# Patient Record
Sex: Female | Born: 1935 | Race: White | Hispanic: No | Smoking: Never smoker
Health system: Southern US, Community
[De-identification: ages and names within clinical notes are randomized; demographics above are authoritative.]

## PROBLEM LIST (undated history)

## (undated) DIAGNOSIS — M1990 Unspecified osteoarthritis, unspecified site: Secondary | ICD-10-CM

## (undated) DIAGNOSIS — Z86718 Personal history of other venous thrombosis and embolism: Secondary | ICD-10-CM

## (undated) DIAGNOSIS — Z96651 Presence of right artificial knee joint: Secondary | ICD-10-CM

## (undated) DIAGNOSIS — F0390 Unspecified dementia without behavioral disturbance: Secondary | ICD-10-CM

## (undated) HISTORY — PX: TOTAL KNEE ARTHROPLASTY: SHX125

---

## 2004-02-02 ENCOUNTER — Ambulatory Visit: Payer: Self-pay | Admitting: Unknown Physician Specialty

## 2005-02-20 ENCOUNTER — Ambulatory Visit: Payer: Self-pay | Admitting: Unknown Physician Specialty

## 2006-03-12 ENCOUNTER — Ambulatory Visit: Payer: Self-pay | Admitting: Unknown Physician Specialty

## 2006-05-30 ENCOUNTER — Ambulatory Visit: Payer: Self-pay | Admitting: Unknown Physician Specialty

## 2007-05-12 ENCOUNTER — Ambulatory Visit: Payer: Self-pay | Admitting: Unknown Physician Specialty

## 2008-01-23 DIAGNOSIS — C8300 Small cell B-cell lymphoma, unspecified site: Secondary | ICD-10-CM

## 2008-01-23 HISTORY — DX: Small cell B-cell lymphoma, unspecified site: C83.00

## 2008-07-08 ENCOUNTER — Ambulatory Visit: Payer: Self-pay | Admitting: Unknown Physician Specialty

## 2009-07-11 ENCOUNTER — Ambulatory Visit: Payer: Self-pay | Admitting: Unknown Physician Specialty

## 2009-09-05 ENCOUNTER — Ambulatory Visit: Payer: Self-pay | Admitting: Gastroenterology

## 2009-09-07 ENCOUNTER — Ambulatory Visit: Payer: Self-pay | Admitting: Gastroenterology

## 2009-10-22 ENCOUNTER — Ambulatory Visit: Payer: Self-pay | Admitting: Internal Medicine

## 2009-11-09 ENCOUNTER — Ambulatory Visit: Payer: Self-pay | Admitting: Internal Medicine

## 2009-11-22 ENCOUNTER — Ambulatory Visit: Payer: Self-pay | Admitting: Internal Medicine

## 2009-12-12 ENCOUNTER — Ambulatory Visit: Payer: Self-pay | Admitting: Gastroenterology

## 2010-03-21 ENCOUNTER — Ambulatory Visit: Payer: Self-pay | Admitting: Internal Medicine

## 2010-03-23 ENCOUNTER — Ambulatory Visit: Payer: Self-pay | Admitting: Internal Medicine

## 2010-05-16 ENCOUNTER — Ambulatory Visit: Payer: Self-pay | Admitting: Internal Medicine

## 2010-05-23 ENCOUNTER — Ambulatory Visit: Payer: Self-pay | Admitting: Internal Medicine

## 2010-07-14 ENCOUNTER — Ambulatory Visit: Payer: Self-pay | Admitting: Unknown Physician Specialty

## 2011-01-11 ENCOUNTER — Other Ambulatory Visit (HOSPITAL_COMMUNITY): Payer: Self-pay | Admitting: Oncology

## 2011-01-11 DIAGNOSIS — C8292 Follicular lymphoma, unspecified, intrathoracic lymph nodes: Secondary | ICD-10-CM

## 2011-01-19 ENCOUNTER — Encounter (HOSPITAL_COMMUNITY)
Admission: RE | Admit: 2011-01-19 | Discharge: 2011-01-19 | Disposition: A | Discharge status: Home / Self Care | Payer: Medicare Other | Source: Ambulatory Visit | Attending: Oncology | Admitting: Oncology

## 2011-01-19 DIAGNOSIS — C8292 Follicular lymphoma, unspecified, intrathoracic lymph nodes: Secondary | ICD-10-CM | POA: Insufficient documentation

## 2011-01-19 DIAGNOSIS — R222 Localized swelling, mass and lump, trunk: Secondary | ICD-10-CM | POA: Insufficient documentation

## 2011-01-19 DIAGNOSIS — J90 Pleural effusion, not elsewhere classified: Secondary | ICD-10-CM | POA: Insufficient documentation

## 2011-01-19 LAB — GLUCOSE, CAPILLARY: Glucose-Capillary: 98 mg/dL (ref 70–99)

## 2011-01-19 MED ORDER — FLUDEOXYGLUCOSE F - 18 (FDG) INJECTION
17.1000 | Freq: Once | INTRAVENOUS | Status: AC | PRN
  Administered 2011-01-19: 17.1 via INTRAVENOUS

## 2011-07-16 ENCOUNTER — Ambulatory Visit: Payer: Self-pay | Admitting: Unknown Physician Specialty

## 2012-06-17 IMAGING — MG MAM DGTL SCREENING MAMMO W/CAD
1 series · 5 of 5 positions shown · non-contrast
Comparison: none

REASON FOR EXAM: screening
COMMENTS:  Submitted by practice: [facility] [HOSPITAL] Scheduled by
user: [place]

[Series 7877: R CC · right · 5 of 5 slices shown]
[im 1/5]
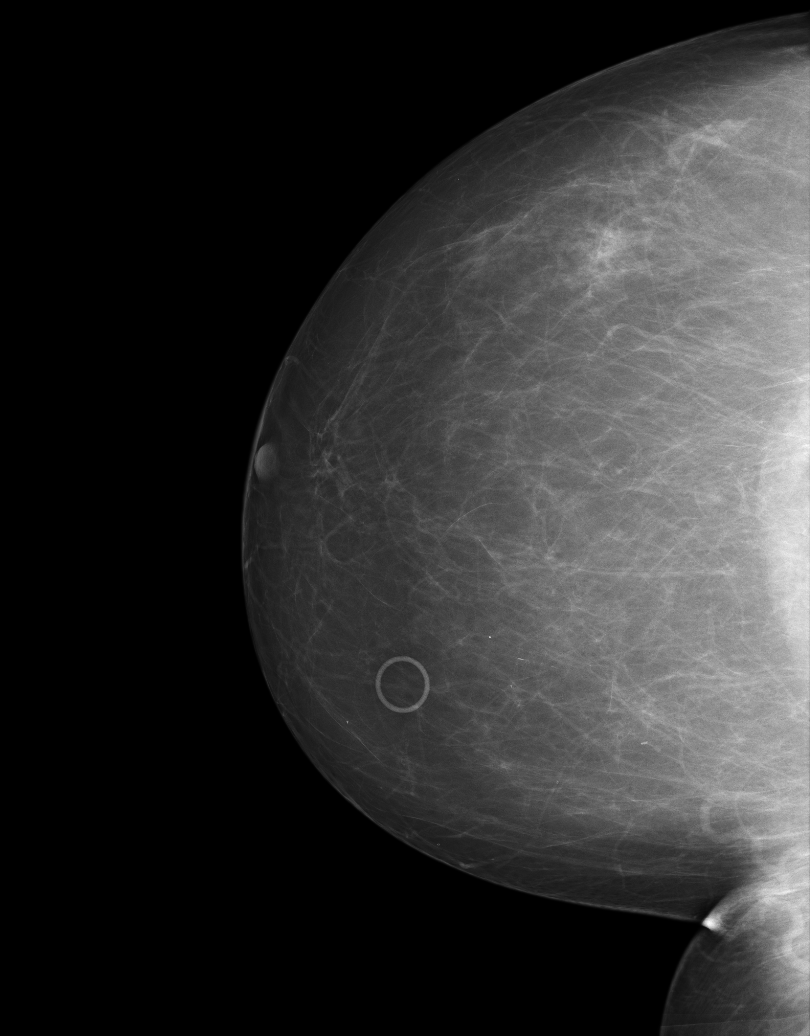
[im 2/5]
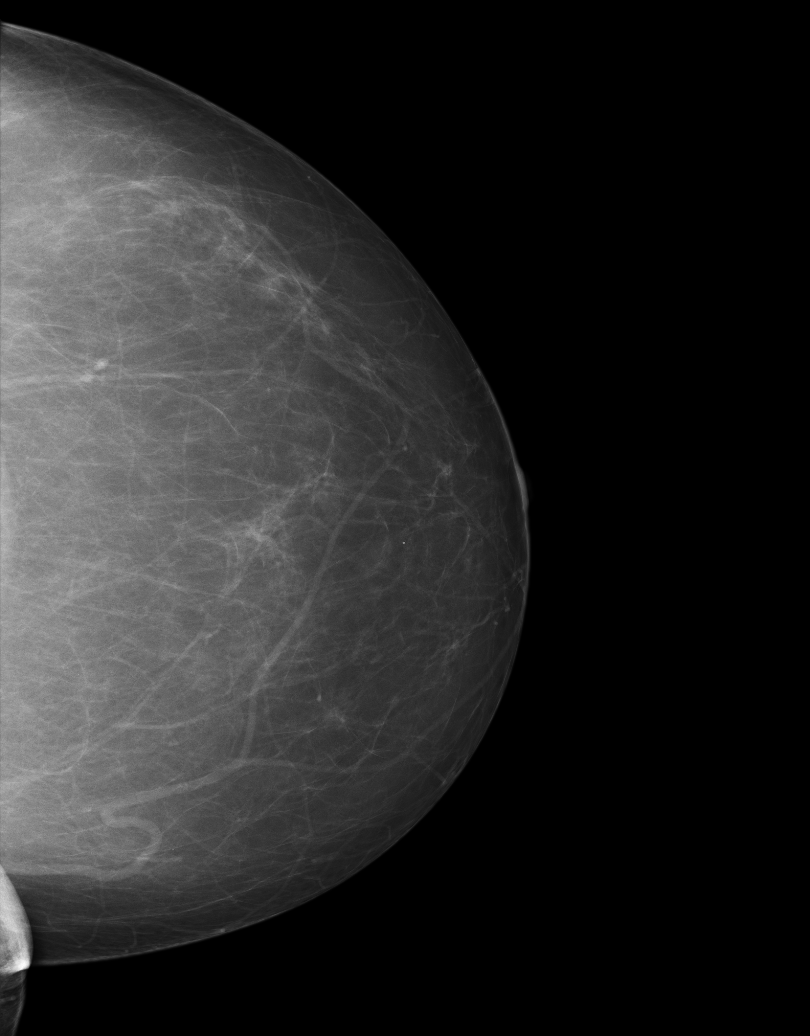
[im 3/5]
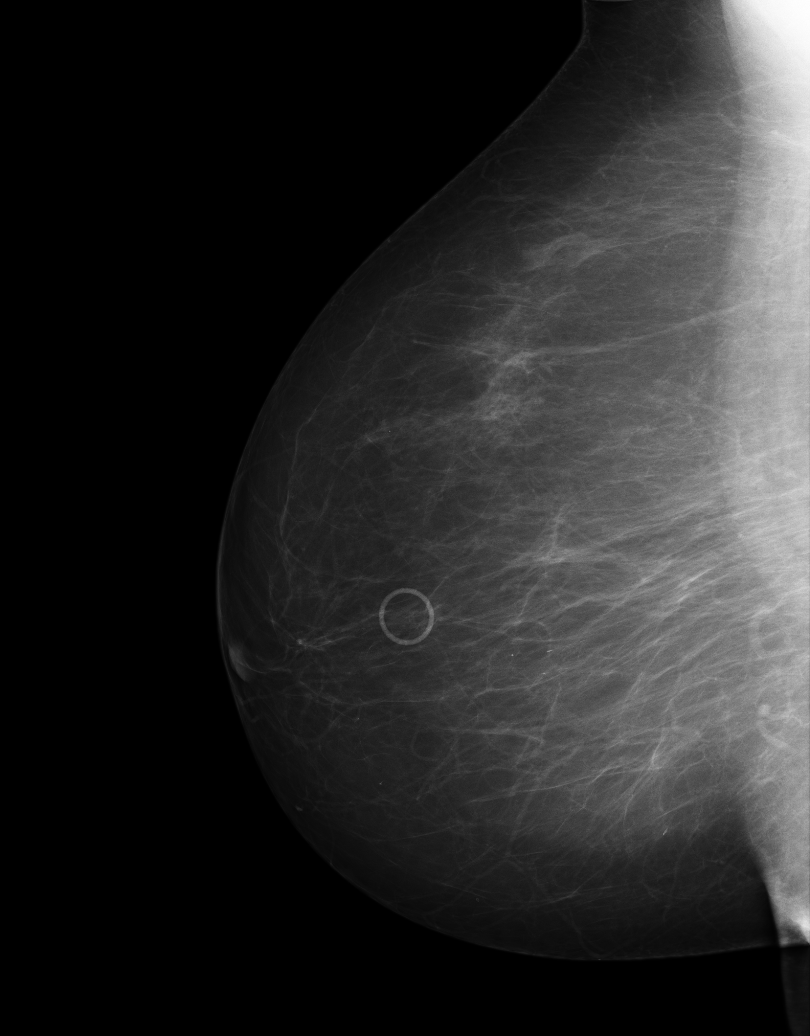
[im 4/5]
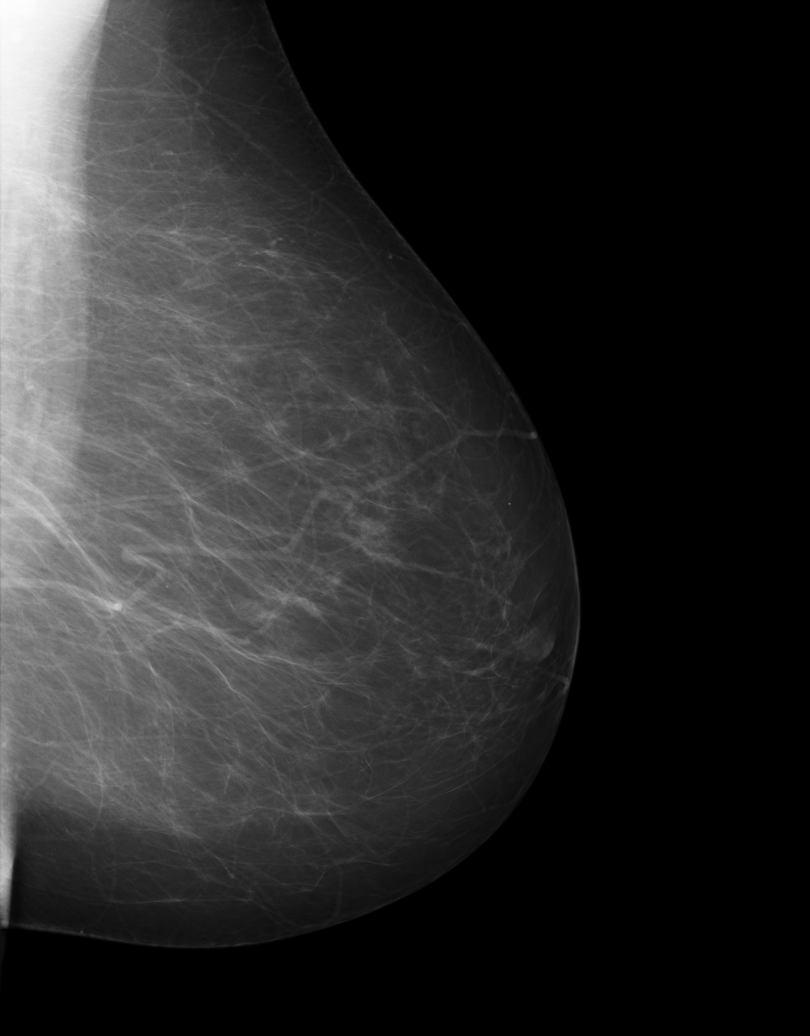
[im 5/5]
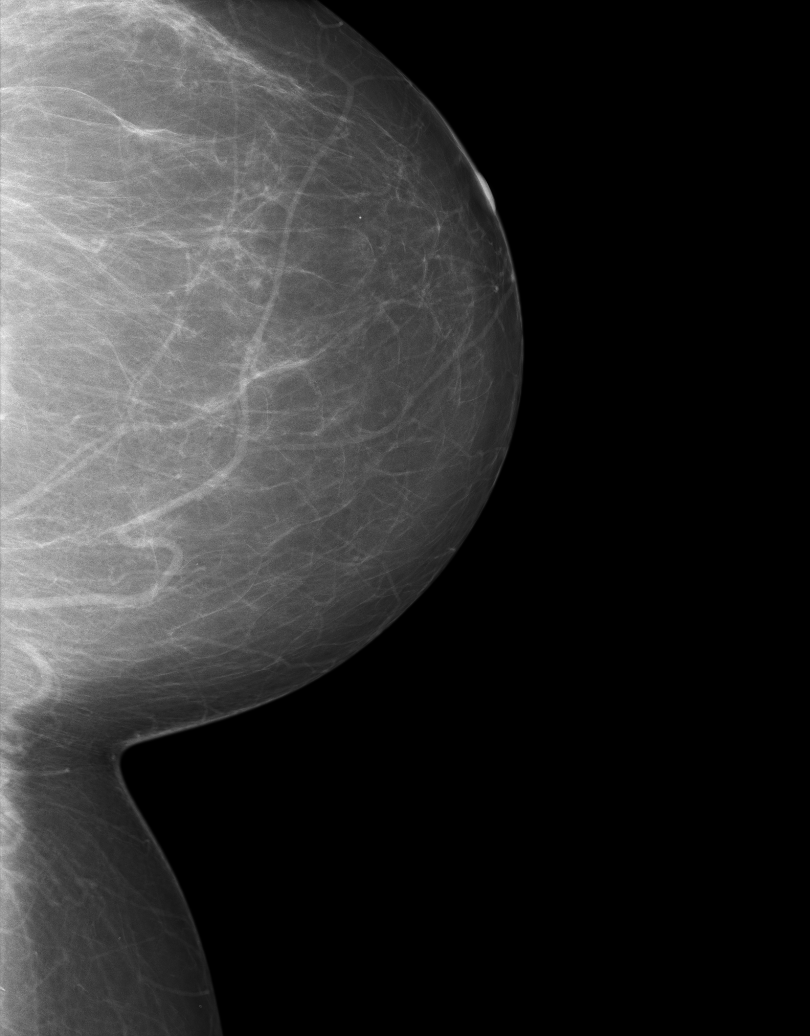

[5 of 5 positions shown; findings below may reference images not displayed]

PROCEDURE:     MAM - MAM DGTL SCREENING MAMMO W/CAD  - July 11, 2009  [DATE]

RESULT:      Comparison is made to the previous analog images of 05/12/07 and
to digital images dated 07/08/08 as well as 12/09/00.
The breasts exhibit a mildly dense parenchymal pattern without evidence of
an area of developing density, dominant mass or malignant-appearing
calcification. A skin nevus is marked along the anterior medial right
breast.
IMPRESSION: 1.     Stable benign-appearing bilateral mammogram.
2.     BI-RADS:  Category 2- Benign Finding.
3.     Please continue to encourage annual mammographic follow up.

A negative mammogram report does not preclude biopsy or other evaluation of
a clinically palpable or otherwise suspicious mass or lesion. Breast cancer
may not be detected by mammography in up to 10% of cases.

## 2012-07-16 ENCOUNTER — Ambulatory Visit: Payer: Self-pay | Admitting: Physician Assistant

## 2013-07-17 ENCOUNTER — Ambulatory Visit: Payer: Self-pay | Admitting: Unknown Physician Specialty

## 2013-07-21 ENCOUNTER — Ambulatory Visit: Payer: Self-pay | Admitting: Gastroenterology

## 2013-07-21 LAB — PROTIME-INR
INR: 0.9
Prothrombin Time: 12 secs (ref 11.5–14.7)

## 2013-12-26 IMAGING — PT NM PET TUM IMG INITIAL (PI) SKULL BASE T - THIGH
6 series · 25 of 25 positions shown · non-contrast
Comparison: CTs of 01/08/2011 from [facility].  No prior
PET.

CLINICAL DATA: Initial treatment strategy for nodular lymphoma of
intrathoracic lymph nodes.

NUCLEAR MEDICINE PET CT SKULL BASE TO THIGH
TECHNIQUE: 17.1 mCi F-18 FDG was injected intravenously via the
left AC.  Full-ring PET imaging was performed from the skull base
through the mid-thighs 70  minutes after injection.  CT data was
obtained and used for attenuation correction and anatomic
localization only.  (This was not acquired as a diagnostic CT
examination.)
Fasting Blood Glucose:  98

[Series 1: pet ac · axial · 3.3mm · 4.69mm/px · z∈[-871,-1]mm · 5 of 267 slices shown]
[im 1/267]
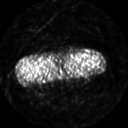
[im 67/267]
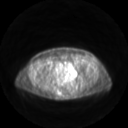
[im 134/267]
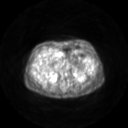
[im 200/267]
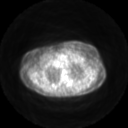
[im 267/267]
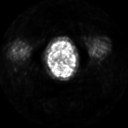

[Series 2: pet nac · axial · 3.3mm · 4.69mm/px · z∈[-871,-1]mm · 6 of 267 slices shown]
[im 1/267]
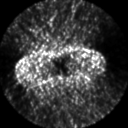
[im 54/267]
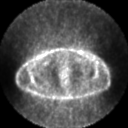
[im 107/267]
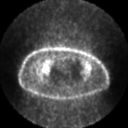
[im 160/267]
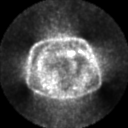
[im 213/267]
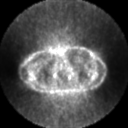
[im 267/267]
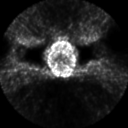

[Series 2: ct images · axial · 3.8mm · 0.98mm/px · z∈[-871,-2]mm · 5 of 267 slices shown]
[im 1/267]
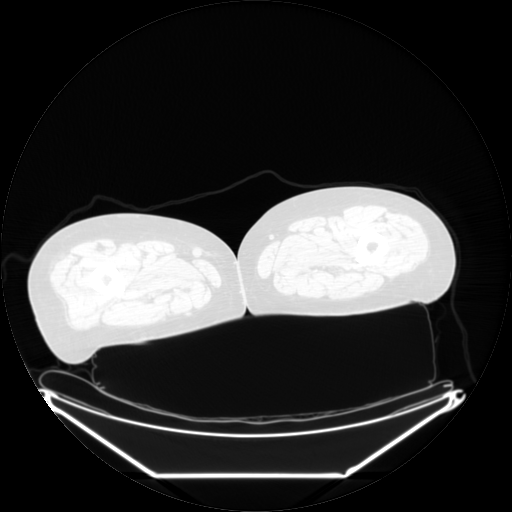
[im 67/267]
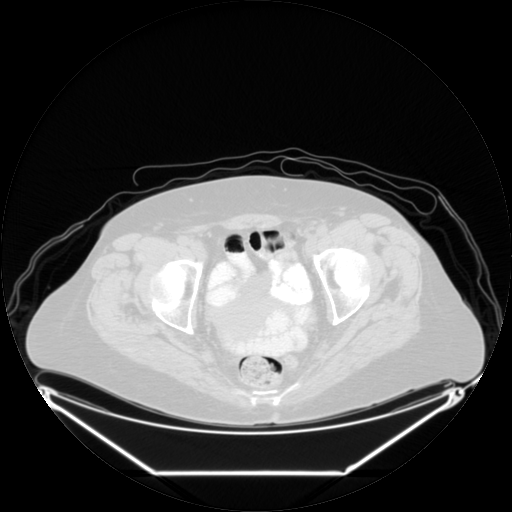
[im 134/267]
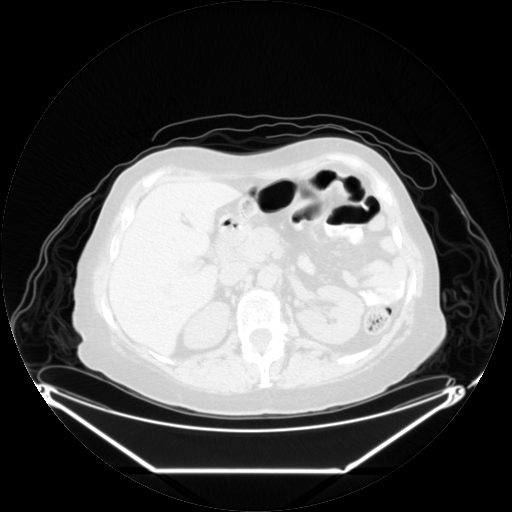
[im 200/267]
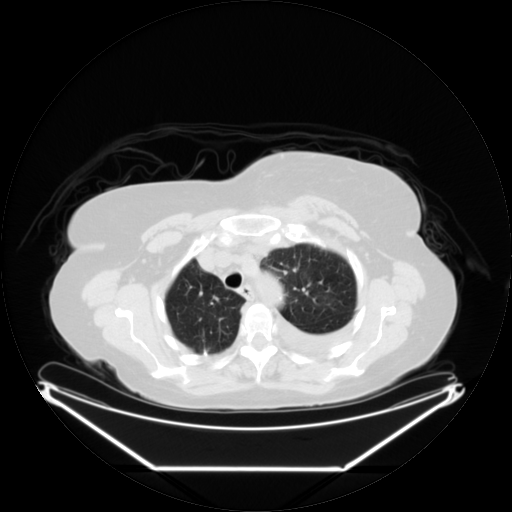
[im 267/267  brain]
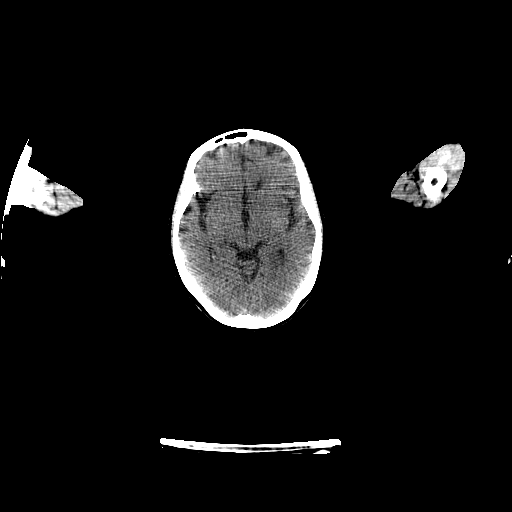

[Series 123: mip · coronal · 3.3mm · 4.69mm/px · 1 of 30 slices shown]
[im 1/30]
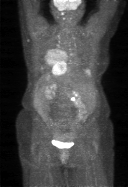

[Series 151: reformatted · axial · 3.3mm · 3.91mm/px · z∈[-871,-1]mm · 6 of 267 slices shown (1 of 2)]
[im 1/267]
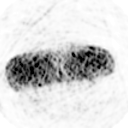
[im 54/267]
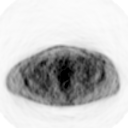
[im 107/267]
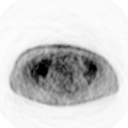
[im 160/267]
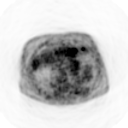
[im 213/267]
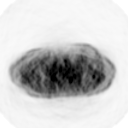
[im 267/267]
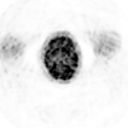

[Series 153: reformatted · coronal · 4.7mm · 6.98mm/px · 2 of 75 slices shown (2 of 2)]
[im 1/75]
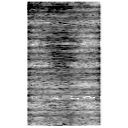
[im 75/75]
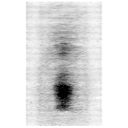

[25 of 25 positions shown; findings below may reference images not displayed]

FINDINGS: PET images demonstrate multifocal areas of
hypermetabolism within the upper neck.  These are felt to be
muscular in etiology and secondary motion after pharmaceutical
injection.

Within the medial upper chest posteriorly, areas of hypermetabolism
about the costovertebral junctions are felt to be related to
muscular activity and/or hypermetabolic "brown" fat.

Left chest wall node measures 1.1 cm and a S.U.V. max of 2.4 on
image 59.

The left anterior chest wall mass measures 9.6 x 5.1 cm on recent
CT.  This measures a S.U.V. max of 11.4 on image 84.

A focus of left-sided pleural hypermetabolism, within the small
left pleural effusion measures a S.U.V. max of 7.9.

Bilateral hypermetabolic pulmonary nodules and masses.  Index right
middle lobe mass measures 5.2 x 3.7 cm on the prior diagnostic CT.
This measures a S.U.V. max of 6.1 on image 105 today.

Hypermetabolic mass within the inferior anterior left pleural space
on image 117.  No evidence of hypermetabolic subdiaphragmatic
disease.

CT images performed for attenuation correction demonstrate no
significant findings within the neck.  Chest, abdomen, and pelvic
findings deferred to recent diagnostic CTs.  No evidence of acute
superimposed process.  IVC filter.
IMPRESSION: 1.  Extensive active disease within the chest.
2.  Likely muscular hypermetabolism about the neck. Recommend
attention on follow-up.
3.  No evidence of subdiaphragmatic disease.

## 2015-01-27 DIAGNOSIS — R5383 Other fatigue: Secondary | ICD-10-CM | POA: Diagnosis not present

## 2015-01-27 DIAGNOSIS — E782 Mixed hyperlipidemia: Secondary | ICD-10-CM | POA: Diagnosis not present

## 2015-01-27 DIAGNOSIS — Z0000 Encounter for general adult medical examination without abnormal findings: Secondary | ICD-10-CM | POA: Diagnosis not present

## 2015-01-27 DIAGNOSIS — I82521 Chronic embolism and thrombosis of right iliac vein: Secondary | ICD-10-CM | POA: Diagnosis not present

## 2015-01-27 DIAGNOSIS — I10 Essential (primary) hypertension: Secondary | ICD-10-CM | POA: Diagnosis not present

## 2015-03-09 DIAGNOSIS — I82521 Chronic embolism and thrombosis of right iliac vein: Secondary | ICD-10-CM | POA: Diagnosis not present

## 2015-04-08 DIAGNOSIS — I82521 Chronic embolism and thrombosis of right iliac vein: Secondary | ICD-10-CM | POA: Diagnosis not present

## 2015-04-12 DIAGNOSIS — M0579 Rheumatoid arthritis with rheumatoid factor of multiple sites without organ or systems involvement: Secondary | ICD-10-CM | POA: Diagnosis not present

## 2015-04-12 DIAGNOSIS — I10 Essential (primary) hypertension: Secondary | ICD-10-CM | POA: Diagnosis not present

## 2015-04-12 DIAGNOSIS — C8592 Non-Hodgkin lymphoma, unspecified, intrathoracic lymph nodes: Secondary | ICD-10-CM | POA: Diagnosis not present

## 2015-04-12 DIAGNOSIS — E782 Mixed hyperlipidemia: Secondary | ICD-10-CM | POA: Diagnosis not present

## 2015-04-12 DIAGNOSIS — M810 Age-related osteoporosis without current pathological fracture: Secondary | ICD-10-CM | POA: Diagnosis not present

## 2015-04-12 DIAGNOSIS — I82401 Acute embolism and thrombosis of unspecified deep veins of right lower extremity: Secondary | ICD-10-CM | POA: Diagnosis not present

## 2015-04-12 DIAGNOSIS — I878 Other specified disorders of veins: Secondary | ICD-10-CM | POA: Diagnosis not present

## 2015-04-18 DIAGNOSIS — C3490 Malignant neoplasm of unspecified part of unspecified bronchus or lung: Secondary | ICD-10-CM | POA: Diagnosis not present

## 2015-04-18 DIAGNOSIS — Z8572 Personal history of non-Hodgkin lymphomas: Secondary | ICD-10-CM | POA: Diagnosis not present

## 2015-05-13 DIAGNOSIS — I82521 Chronic embolism and thrombosis of right iliac vein: Secondary | ICD-10-CM | POA: Diagnosis not present

## 2015-05-30 DIAGNOSIS — I82521 Chronic embolism and thrombosis of right iliac vein: Secondary | ICD-10-CM | POA: Diagnosis not present

## 2015-06-28 DIAGNOSIS — I82521 Chronic embolism and thrombosis of right iliac vein: Secondary | ICD-10-CM | POA: Diagnosis not present

## 2015-07-01 DIAGNOSIS — I82521 Chronic embolism and thrombosis of right iliac vein: Secondary | ICD-10-CM | POA: Diagnosis not present

## 2015-07-08 DIAGNOSIS — I82521 Chronic embolism and thrombosis of right iliac vein: Secondary | ICD-10-CM | POA: Diagnosis not present

## 2015-07-29 DIAGNOSIS — I82521 Chronic embolism and thrombosis of right iliac vein: Secondary | ICD-10-CM | POA: Diagnosis not present

## 2015-08-10 DIAGNOSIS — I82521 Chronic embolism and thrombosis of right iliac vein: Secondary | ICD-10-CM | POA: Diagnosis not present

## 2015-08-19 DIAGNOSIS — I82521 Chronic embolism and thrombosis of right iliac vein: Secondary | ICD-10-CM | POA: Diagnosis not present

## 2015-09-30 DIAGNOSIS — I82521 Chronic embolism and thrombosis of right iliac vein: Secondary | ICD-10-CM | POA: Diagnosis not present

## 2015-10-04 DIAGNOSIS — G8929 Other chronic pain: Secondary | ICD-10-CM | POA: Diagnosis not present

## 2015-10-04 DIAGNOSIS — C8512 Unspecified B-cell lymphoma, intrathoracic lymph nodes: Secondary | ICD-10-CM | POA: Diagnosis not present

## 2015-10-04 DIAGNOSIS — M25562 Pain in left knee: Secondary | ICD-10-CM | POA: Diagnosis not present

## 2015-10-04 DIAGNOSIS — M0579 Rheumatoid arthritis with rheumatoid factor of multiple sites without organ or systems involvement: Secondary | ICD-10-CM | POA: Diagnosis not present

## 2015-10-04 DIAGNOSIS — M150 Primary generalized (osteo)arthritis: Secondary | ICD-10-CM | POA: Diagnosis not present

## 2015-10-13 DIAGNOSIS — I82401 Acute embolism and thrombosis of unspecified deep veins of right lower extremity: Secondary | ICD-10-CM | POA: Diagnosis not present

## 2015-10-13 DIAGNOSIS — E782 Mixed hyperlipidemia: Secondary | ICD-10-CM | POA: Diagnosis not present

## 2015-10-13 DIAGNOSIS — I10 Essential (primary) hypertension: Secondary | ICD-10-CM | POA: Diagnosis not present

## 2015-10-13 DIAGNOSIS — Z23 Encounter for immunization: Secondary | ICD-10-CM | POA: Diagnosis not present

## 2015-10-13 DIAGNOSIS — R202 Paresthesia of skin: Secondary | ICD-10-CM | POA: Diagnosis not present

## 2015-10-13 DIAGNOSIS — R7303 Prediabetes: Secondary | ICD-10-CM | POA: Diagnosis not present

## 2015-10-13 DIAGNOSIS — C8592 Non-Hodgkin lymphoma, unspecified, intrathoracic lymph nodes: Secondary | ICD-10-CM | POA: Diagnosis not present

## 2015-10-13 DIAGNOSIS — I82521 Chronic embolism and thrombosis of right iliac vein: Secondary | ICD-10-CM | POA: Diagnosis not present

## 2015-10-13 DIAGNOSIS — R634 Abnormal weight loss: Secondary | ICD-10-CM | POA: Diagnosis not present

## 2015-10-20 DIAGNOSIS — J9811 Atelectasis: Secondary | ICD-10-CM | POA: Diagnosis not present

## 2015-10-20 DIAGNOSIS — C8102 Nodular lymphocyte predominant Hodgkin lymphoma, intrathoracic lymph nodes: Secondary | ICD-10-CM | POA: Diagnosis not present

## 2015-10-26 DIAGNOSIS — C8190 Hodgkin lymphoma, unspecified, unspecified site: Secondary | ICD-10-CM | POA: Diagnosis not present

## 2015-10-26 DIAGNOSIS — C8102 Nodular lymphocyte predominant Hodgkin lymphoma, intrathoracic lymph nodes: Secondary | ICD-10-CM | POA: Diagnosis not present

## 2015-10-27 DIAGNOSIS — Z8572 Personal history of non-Hodgkin lymphomas: Secondary | ICD-10-CM | POA: Diagnosis not present

## 2015-10-27 DIAGNOSIS — C884 Extranodal marginal zone B-cell lymphoma of mucosa-associated lymphoid tissue [MALT-lymphoma]: Secondary | ICD-10-CM | POA: Diagnosis not present

## 2015-10-27 DIAGNOSIS — R634 Abnormal weight loss: Secondary | ICD-10-CM | POA: Diagnosis not present

## 2015-11-10 DIAGNOSIS — I82521 Chronic embolism and thrombosis of right iliac vein: Secondary | ICD-10-CM | POA: Diagnosis not present

## 2015-12-02 DIAGNOSIS — Z23 Encounter for immunization: Secondary | ICD-10-CM | POA: Diagnosis not present

## 2015-12-12 DIAGNOSIS — I82521 Chronic embolism and thrombosis of right iliac vein: Secondary | ICD-10-CM | POA: Diagnosis not present

## 2016-01-03 DIAGNOSIS — H52223 Regular astigmatism, bilateral: Secondary | ICD-10-CM | POA: Diagnosis not present

## 2016-01-03 DIAGNOSIS — H26491 Other secondary cataract, right eye: Secondary | ICD-10-CM | POA: Diagnosis not present

## 2016-01-11 DIAGNOSIS — I82521 Chronic embolism and thrombosis of right iliac vein: Secondary | ICD-10-CM | POA: Diagnosis not present

## 2016-01-19 DIAGNOSIS — I82521 Chronic embolism and thrombosis of right iliac vein: Secondary | ICD-10-CM | POA: Diagnosis not present

## 2016-01-20 DIAGNOSIS — H18413 Arcus senilis, bilateral: Secondary | ICD-10-CM | POA: Diagnosis not present

## 2016-01-20 DIAGNOSIS — H02839 Dermatochalasis of unspecified eye, unspecified eyelid: Secondary | ICD-10-CM | POA: Diagnosis not present

## 2016-01-20 DIAGNOSIS — H26491 Other secondary cataract, right eye: Secondary | ICD-10-CM | POA: Diagnosis not present

## 2016-01-20 DIAGNOSIS — Z961 Presence of intraocular lens: Secondary | ICD-10-CM | POA: Diagnosis not present

## 2016-02-03 DIAGNOSIS — H26492 Other secondary cataract, left eye: Secondary | ICD-10-CM | POA: Diagnosis not present

## 2016-02-03 DIAGNOSIS — H26491 Other secondary cataract, right eye: Secondary | ICD-10-CM | POA: Diagnosis not present

## 2016-02-27 DIAGNOSIS — I82521 Chronic embolism and thrombosis of right iliac vein: Secondary | ICD-10-CM | POA: Diagnosis not present

## 2016-03-23 DIAGNOSIS — I82521 Chronic embolism and thrombosis of right iliac vein: Secondary | ICD-10-CM | POA: Diagnosis not present

## 2016-04-25 ENCOUNTER — Other Ambulatory Visit: Payer: Self-pay | Admitting: Physician Assistant

## 2016-04-25 DIAGNOSIS — I82501 Chronic embolism and thrombosis of unspecified deep veins of right lower extremity: Secondary | ICD-10-CM | POA: Diagnosis not present

## 2016-04-30 DIAGNOSIS — Z8572 Personal history of non-Hodgkin lymphomas: Secondary | ICD-10-CM | POA: Diagnosis not present

## 2016-04-30 DIAGNOSIS — C8102 Nodular lymphocyte predominant Hodgkin lymphoma, intrathoracic lymph nodes: Secondary | ICD-10-CM | POA: Diagnosis not present

## 2016-05-15 DIAGNOSIS — I82501 Chronic embolism and thrombosis of unspecified deep veins of right lower extremity: Secondary | ICD-10-CM | POA: Diagnosis not present

## 2016-05-21 DIAGNOSIS — I82501 Chronic embolism and thrombosis of unspecified deep veins of right lower extremity: Secondary | ICD-10-CM | POA: Diagnosis not present

## 2016-05-25 DIAGNOSIS — I82501 Chronic embolism and thrombosis of unspecified deep veins of right lower extremity: Secondary | ICD-10-CM | POA: Diagnosis not present

## 2016-06-06 DIAGNOSIS — I82501 Chronic embolism and thrombosis of unspecified deep veins of right lower extremity: Secondary | ICD-10-CM | POA: Diagnosis not present

## 2016-06-25 DIAGNOSIS — I82501 Chronic embolism and thrombosis of unspecified deep veins of right lower extremity: Secondary | ICD-10-CM | POA: Diagnosis not present

## 2016-06-29 DIAGNOSIS — I82401 Acute embolism and thrombosis of unspecified deep veins of right lower extremity: Secondary | ICD-10-CM | POA: Diagnosis not present

## 2016-06-29 DIAGNOSIS — M150 Primary generalized (osteo)arthritis: Secondary | ICD-10-CM | POA: Diagnosis not present

## 2016-06-29 DIAGNOSIS — C8599 Non-Hodgkin lymphoma, unspecified, extranodal and solid organ sites: Secondary | ICD-10-CM | POA: Diagnosis not present

## 2016-06-29 DIAGNOSIS — I10 Essential (primary) hypertension: Secondary | ICD-10-CM | POA: Diagnosis not present

## 2016-06-29 DIAGNOSIS — M0579 Rheumatoid arthritis with rheumatoid factor of multiple sites without organ or systems involvement: Secondary | ICD-10-CM | POA: Diagnosis not present

## 2016-06-29 DIAGNOSIS — E782 Mixed hyperlipidemia: Secondary | ICD-10-CM | POA: Diagnosis not present

## 2016-06-29 DIAGNOSIS — R7303 Prediabetes: Secondary | ICD-10-CM | POA: Diagnosis not present

## 2016-07-03 DIAGNOSIS — G8929 Other chronic pain: Secondary | ICD-10-CM | POA: Diagnosis not present

## 2016-07-03 DIAGNOSIS — M150 Primary generalized (osteo)arthritis: Secondary | ICD-10-CM | POA: Diagnosis not present

## 2016-07-03 DIAGNOSIS — M1712 Unilateral primary osteoarthritis, left knee: Secondary | ICD-10-CM | POA: Diagnosis not present

## 2016-07-03 DIAGNOSIS — M25562 Pain in left knee: Secondary | ICD-10-CM | POA: Diagnosis not present

## 2016-07-03 DIAGNOSIS — C8512 Unspecified B-cell lymphoma, intrathoracic lymph nodes: Secondary | ICD-10-CM | POA: Diagnosis not present

## 2016-07-03 DIAGNOSIS — M0579 Rheumatoid arthritis with rheumatoid factor of multiple sites without organ or systems involvement: Secondary | ICD-10-CM | POA: Diagnosis not present

## 2016-07-26 DIAGNOSIS — I82501 Chronic embolism and thrombosis of unspecified deep veins of right lower extremity: Secondary | ICD-10-CM | POA: Diagnosis not present

## 2016-08-27 DIAGNOSIS — Z7901 Long term (current) use of anticoagulants: Secondary | ICD-10-CM | POA: Diagnosis not present

## 2016-09-11 DIAGNOSIS — Z7901 Long term (current) use of anticoagulants: Secondary | ICD-10-CM | POA: Diagnosis not present

## 2016-10-03 DIAGNOSIS — Z7901 Long term (current) use of anticoagulants: Secondary | ICD-10-CM | POA: Diagnosis not present

## 2016-10-24 DIAGNOSIS — Z23 Encounter for immunization: Secondary | ICD-10-CM | POA: Diagnosis not present

## 2016-11-05 DIAGNOSIS — C8190 Hodgkin lymphoma, unspecified, unspecified site: Secondary | ICD-10-CM | POA: Diagnosis not present

## 2016-11-05 DIAGNOSIS — Z8572 Personal history of non-Hodgkin lymphomas: Secondary | ICD-10-CM | POA: Diagnosis not present

## 2016-11-07 DIAGNOSIS — Z7901 Long term (current) use of anticoagulants: Secondary | ICD-10-CM | POA: Diagnosis not present

## 2017-01-24 DIAGNOSIS — Z7901 Long term (current) use of anticoagulants: Secondary | ICD-10-CM | POA: Diagnosis not present

## 2017-02-13 DIAGNOSIS — Z7901 Long term (current) use of anticoagulants: Secondary | ICD-10-CM | POA: Diagnosis not present

## 2017-02-20 DIAGNOSIS — Z7901 Long term (current) use of anticoagulants: Secondary | ICD-10-CM | POA: Diagnosis not present

## 2017-03-22 DIAGNOSIS — Z86718 Personal history of other venous thrombosis and embolism: Secondary | ICD-10-CM | POA: Diagnosis not present

## 2017-05-06 DIAGNOSIS — R918 Other nonspecific abnormal finding of lung field: Secondary | ICD-10-CM | POA: Diagnosis not present

## 2017-05-06 DIAGNOSIS — Z8572 Personal history of non-Hodgkin lymphomas: Secondary | ICD-10-CM | POA: Diagnosis not present

## 2017-06-24 DIAGNOSIS — J310 Chronic rhinitis: Secondary | ICD-10-CM | POA: Diagnosis not present

## 2017-06-24 DIAGNOSIS — M1712 Unilateral primary osteoarthritis, left knee: Secondary | ICD-10-CM | POA: Diagnosis not present

## 2017-06-24 DIAGNOSIS — Z8579 Personal history of other malignant neoplasms of lymphoid, hematopoietic and related tissues: Secondary | ICD-10-CM | POA: Diagnosis not present

## 2017-06-24 DIAGNOSIS — I10 Essential (primary) hypertension: Secondary | ICD-10-CM | POA: Diagnosis not present

## 2017-07-30 DIAGNOSIS — F0390 Unspecified dementia without behavioral disturbance: Secondary | ICD-10-CM | POA: Diagnosis not present

## 2017-07-30 DIAGNOSIS — M1712 Unilateral primary osteoarthritis, left knee: Secondary | ICD-10-CM | POA: Diagnosis not present

## 2017-07-30 DIAGNOSIS — Z1331 Encounter for screening for depression: Secondary | ICD-10-CM | POA: Diagnosis not present

## 2017-07-30 DIAGNOSIS — Z1389 Encounter for screening for other disorder: Secondary | ICD-10-CM | POA: Diagnosis not present

## 2017-07-30 DIAGNOSIS — I10 Essential (primary) hypertension: Secondary | ICD-10-CM | POA: Diagnosis not present

## 2017-07-30 DIAGNOSIS — Z0001 Encounter for general adult medical examination with abnormal findings: Secondary | ICD-10-CM | POA: Diagnosis not present

## 2017-07-31 DIAGNOSIS — I10 Essential (primary) hypertension: Secondary | ICD-10-CM | POA: Diagnosis not present

## 2017-07-31 DIAGNOSIS — E785 Hyperlipidemia, unspecified: Secondary | ICD-10-CM | POA: Diagnosis not present

## 2017-07-31 DIAGNOSIS — Z8579 Personal history of other malignant neoplasms of lymphoid, hematopoietic and related tissues: Secondary | ICD-10-CM | POA: Diagnosis not present

## 2017-07-31 DIAGNOSIS — R413 Other amnesia: Secondary | ICD-10-CM | POA: Diagnosis not present

## 2017-07-31 DIAGNOSIS — Z7901 Long term (current) use of anticoagulants: Secondary | ICD-10-CM | POA: Diagnosis not present

## 2017-08-29 DIAGNOSIS — I10 Essential (primary) hypertension: Secondary | ICD-10-CM | POA: Diagnosis not present

## 2017-08-29 DIAGNOSIS — M059 Rheumatoid arthritis with rheumatoid factor, unspecified: Secondary | ICD-10-CM | POA: Diagnosis not present

## 2017-08-29 DIAGNOSIS — F0390 Unspecified dementia without behavioral disturbance: Secondary | ICD-10-CM | POA: Diagnosis not present

## 2017-08-29 DIAGNOSIS — Z86718 Personal history of other venous thrombosis and embolism: Secondary | ICD-10-CM | POA: Diagnosis not present

## 2017-08-29 DIAGNOSIS — J310 Chronic rhinitis: Secondary | ICD-10-CM | POA: Diagnosis not present

## 2017-11-05 DIAGNOSIS — D649 Anemia, unspecified: Secondary | ICD-10-CM | POA: Diagnosis not present

## 2017-11-05 DIAGNOSIS — R4182 Altered mental status, unspecified: Secondary | ICD-10-CM | POA: Diagnosis not present

## 2017-11-05 DIAGNOSIS — Z8572 Personal history of non-Hodgkin lymphomas: Secondary | ICD-10-CM | POA: Diagnosis not present

## 2017-11-05 DIAGNOSIS — R634 Abnormal weight loss: Secondary | ICD-10-CM | POA: Diagnosis not present

## 2017-11-05 DIAGNOSIS — C8102 Nodular lymphocyte predominant Hodgkin lymphoma, intrathoracic lymph nodes: Secondary | ICD-10-CM | POA: Diagnosis not present

## 2017-11-05 DIAGNOSIS — J984 Other disorders of lung: Secondary | ICD-10-CM | POA: Diagnosis not present

## 2017-12-02 DIAGNOSIS — J310 Chronic rhinitis: Secondary | ICD-10-CM | POA: Diagnosis not present

## 2017-12-02 DIAGNOSIS — R7303 Prediabetes: Secondary | ICD-10-CM | POA: Diagnosis not present

## 2017-12-02 DIAGNOSIS — R634 Abnormal weight loss: Secondary | ICD-10-CM | POA: Diagnosis not present

## 2017-12-02 DIAGNOSIS — Z23 Encounter for immunization: Secondary | ICD-10-CM | POA: Diagnosis not present

## 2017-12-02 DIAGNOSIS — I10 Essential (primary) hypertension: Secondary | ICD-10-CM | POA: Diagnosis not present

## 2017-12-02 DIAGNOSIS — Z79899 Other long term (current) drug therapy: Secondary | ICD-10-CM | POA: Diagnosis not present

## 2017-12-02 DIAGNOSIS — Z8579 Personal history of other malignant neoplasms of lymphoid, hematopoietic and related tissues: Secondary | ICD-10-CM | POA: Diagnosis not present

## 2017-12-02 DIAGNOSIS — Z6822 Body mass index (BMI) 22.0-22.9, adult: Secondary | ICD-10-CM | POA: Diagnosis not present

## 2018-03-06 DIAGNOSIS — I10 Essential (primary) hypertension: Secondary | ICD-10-CM | POA: Diagnosis not present

## 2018-03-06 DIAGNOSIS — F0390 Unspecified dementia without behavioral disturbance: Secondary | ICD-10-CM | POA: Diagnosis not present

## 2018-03-06 DIAGNOSIS — Z6822 Body mass index (BMI) 22.0-22.9, adult: Secondary | ICD-10-CM | POA: Diagnosis not present

## 2018-03-06 DIAGNOSIS — J310 Chronic rhinitis: Secondary | ICD-10-CM | POA: Diagnosis not present

## 2018-04-17 DIAGNOSIS — Z6823 Body mass index (BMI) 23.0-23.9, adult: Secondary | ICD-10-CM | POA: Diagnosis not present

## 2018-04-17 DIAGNOSIS — I10 Essential (primary) hypertension: Secondary | ICD-10-CM | POA: Diagnosis not present

## 2018-04-17 DIAGNOSIS — Z8579 Personal history of other malignant neoplasms of lymphoid, hematopoietic and related tissues: Secondary | ICD-10-CM | POA: Diagnosis not present

## 2018-04-17 DIAGNOSIS — J310 Chronic rhinitis: Secondary | ICD-10-CM | POA: Diagnosis not present

## 2018-04-17 DIAGNOSIS — F0390 Unspecified dementia without behavioral disturbance: Secondary | ICD-10-CM | POA: Diagnosis not present

## 2018-05-08 DIAGNOSIS — Z8579 Personal history of other malignant neoplasms of lymphoid, hematopoietic and related tissues: Secondary | ICD-10-CM | POA: Diagnosis not present

## 2018-05-08 DIAGNOSIS — Z8572 Personal history of non-Hodgkin lymphomas: Secondary | ICD-10-CM | POA: Diagnosis not present

## 2018-09-30 DIAGNOSIS — Z8579 Personal history of other malignant neoplasms of lymphoid, hematopoietic and related tissues: Secondary | ICD-10-CM | POA: Diagnosis not present

## 2018-09-30 DIAGNOSIS — K5900 Constipation, unspecified: Secondary | ICD-10-CM | POA: Diagnosis not present

## 2018-09-30 DIAGNOSIS — Z23 Encounter for immunization: Secondary | ICD-10-CM | POA: Diagnosis not present

## 2018-09-30 DIAGNOSIS — Z1331 Encounter for screening for depression: Secondary | ICD-10-CM | POA: Diagnosis not present

## 2018-09-30 DIAGNOSIS — Z79899 Other long term (current) drug therapy: Secondary | ICD-10-CM | POA: Diagnosis not present

## 2018-09-30 DIAGNOSIS — Z6822 Body mass index (BMI) 22.0-22.9, adult: Secondary | ICD-10-CM | POA: Diagnosis not present

## 2018-09-30 DIAGNOSIS — F0390 Unspecified dementia without behavioral disturbance: Secondary | ICD-10-CM | POA: Diagnosis not present

## 2018-09-30 DIAGNOSIS — I10 Essential (primary) hypertension: Secondary | ICD-10-CM | POA: Diagnosis not present

## 2018-12-10 DIAGNOSIS — Z23 Encounter for immunization: Secondary | ICD-10-CM | POA: Diagnosis not present

## 2019-02-11 DIAGNOSIS — Z1231 Encounter for screening mammogram for malignant neoplasm of breast: Secondary | ICD-10-CM | POA: Diagnosis not present

## 2019-02-27 DIAGNOSIS — G309 Alzheimer's disease, unspecified: Secondary | ICD-10-CM | POA: Diagnosis not present

## 2019-02-27 DIAGNOSIS — Z6823 Body mass index (BMI) 23.0-23.9, adult: Secondary | ICD-10-CM | POA: Diagnosis not present

## 2019-05-08 DIAGNOSIS — Z8572 Personal history of non-Hodgkin lymphomas: Secondary | ICD-10-CM | POA: Diagnosis not present

## 2019-05-08 DIAGNOSIS — C8102 Nodular lymphocyte predominant Hodgkin lymphoma, intrathoracic lymph nodes: Secondary | ICD-10-CM | POA: Diagnosis not present

## 2019-08-28 DIAGNOSIS — F0390 Unspecified dementia without behavioral disturbance: Secondary | ICD-10-CM | POA: Diagnosis not present

## 2019-08-28 DIAGNOSIS — M25569 Pain in unspecified knee: Secondary | ICD-10-CM | POA: Diagnosis not present

## 2019-08-28 DIAGNOSIS — Z6823 Body mass index (BMI) 23.0-23.9, adult: Secondary | ICD-10-CM | POA: Diagnosis not present

## 2019-08-28 DIAGNOSIS — I10 Essential (primary) hypertension: Secondary | ICD-10-CM | POA: Diagnosis not present

## 2019-08-28 DIAGNOSIS — Z1331 Encounter for screening for depression: Secondary | ICD-10-CM | POA: Diagnosis not present

## 2019-08-28 DIAGNOSIS — Z79899 Other long term (current) drug therapy: Secondary | ICD-10-CM | POA: Diagnosis not present

## 2019-09-18 DIAGNOSIS — Z79899 Other long term (current) drug therapy: Secondary | ICD-10-CM | POA: Diagnosis not present

## 2019-09-18 DIAGNOSIS — D72819 Decreased white blood cell count, unspecified: Secondary | ICD-10-CM | POA: Diagnosis not present

## 2019-10-06 ENCOUNTER — Other Ambulatory Visit (HOSPITAL_COMMUNITY): Payer: Self-pay | Admitting: Orthopedic Surgery

## 2019-10-06 DIAGNOSIS — M7989 Other specified soft tissue disorders: Secondary | ICD-10-CM

## 2019-10-06 DIAGNOSIS — M25572 Pain in left ankle and joints of left foot: Secondary | ICD-10-CM | POA: Diagnosis not present

## 2019-10-06 DIAGNOSIS — M1712 Unilateral primary osteoarthritis, left knee: Secondary | ICD-10-CM | POA: Diagnosis not present

## 2019-10-07 ENCOUNTER — Ambulatory Visit (HOSPITAL_COMMUNITY)
Admission: RE | Admit: 2019-10-07 | Discharge: 2019-10-07 | Disposition: A | Discharge status: Home / Self Care | Payer: Medicare Other | Source: Ambulatory Visit | Attending: Orthopedic Surgery | Admitting: Orthopedic Surgery

## 2019-10-07 ENCOUNTER — Other Ambulatory Visit (HOSPITAL_COMMUNITY): Payer: Self-pay | Admitting: Physician Assistant

## 2019-10-07 ENCOUNTER — Inpatient Hospital Stay (HOSPITAL_COMMUNITY)
Admission: RE | Admit: 2019-10-07 | Discharge: 2019-10-09 | DRG: 294 | Disposition: A | Discharge status: Home / Self Care | Payer: Medicare Other | Source: Ambulatory Visit | Attending: Physician Assistant | Admitting: Physician Assistant

## 2019-10-07 ENCOUNTER — Other Ambulatory Visit: Payer: Self-pay

## 2019-10-07 ENCOUNTER — Encounter (HOSPITAL_COMMUNITY): Payer: Self-pay

## 2019-10-07 ENCOUNTER — Other Ambulatory Visit (HOSPITAL_COMMUNITY): Payer: Self-pay | Admitting: Orthopedic Surgery

## 2019-10-07 VITALS — BP 144/83 | HR 57 | Temp 97.9°F | Resp 17 | Ht 69.0 in | Wt 136.2 lb

## 2019-10-07 DIAGNOSIS — I82409 Acute embolism and thrombosis of unspecified deep veins of unspecified lower extremity: Secondary | ICD-10-CM | POA: Diagnosis not present

## 2019-10-07 DIAGNOSIS — N179 Acute kidney failure, unspecified: Secondary | ICD-10-CM | POA: Insufficient documentation

## 2019-10-07 DIAGNOSIS — Z86718 Personal history of other venous thrombosis and embolism: Secondary | ICD-10-CM

## 2019-10-07 DIAGNOSIS — I824Z2 Acute embolism and thrombosis of unspecified deep veins of left distal lower extremity: Secondary | ICD-10-CM | POA: Diagnosis present

## 2019-10-07 DIAGNOSIS — M7989 Other specified soft tissue disorders: Secondary | ICD-10-CM | POA: Diagnosis not present

## 2019-10-07 DIAGNOSIS — I82412 Acute embolism and thrombosis of left femoral vein: Secondary | ICD-10-CM | POA: Diagnosis not present

## 2019-10-07 DIAGNOSIS — M1712 Unilateral primary osteoarthritis, left knee: Secondary | ICD-10-CM | POA: Diagnosis present

## 2019-10-07 DIAGNOSIS — Z96651 Presence of right artificial knee joint: Secondary | ICD-10-CM | POA: Diagnosis present

## 2019-10-07 DIAGNOSIS — Z8572 Personal history of non-Hodgkin lymphomas: Secondary | ICD-10-CM

## 2019-10-07 DIAGNOSIS — I358 Other nonrheumatic aortic valve disorders: Secondary | ICD-10-CM | POA: Diagnosis not present

## 2019-10-07 DIAGNOSIS — I82492 Acute embolism and thrombosis of other specified deep vein of left lower extremity: Secondary | ICD-10-CM | POA: Diagnosis not present

## 2019-10-07 DIAGNOSIS — I82402 Acute embolism and thrombosis of unspecified deep veins of left lower extremity: Secondary | ICD-10-CM

## 2019-10-07 DIAGNOSIS — I10 Essential (primary) hypertension: Secondary | ICD-10-CM | POA: Diagnosis present

## 2019-10-07 DIAGNOSIS — R32 Unspecified urinary incontinence: Secondary | ICD-10-CM | POA: Diagnosis present

## 2019-10-07 DIAGNOSIS — C8292 Follicular lymphoma, unspecified, intrathoracic lymph nodes: Secondary | ICD-10-CM | POA: Diagnosis not present

## 2019-10-07 DIAGNOSIS — E860 Dehydration: Secondary | ICD-10-CM | POA: Diagnosis present

## 2019-10-07 DIAGNOSIS — I82462 Acute embolism and thrombosis of left calf muscular vein: Secondary | ICD-10-CM | POA: Diagnosis not present

## 2019-10-07 DIAGNOSIS — I82220 Acute embolism and thrombosis of inferior vena cava: Secondary | ICD-10-CM | POA: Diagnosis not present

## 2019-10-07 DIAGNOSIS — I82442 Acute embolism and thrombosis of left tibial vein: Secondary | ICD-10-CM | POA: Diagnosis not present

## 2019-10-07 DIAGNOSIS — Z66 Do not resuscitate: Secondary | ICD-10-CM | POA: Diagnosis present

## 2019-10-07 DIAGNOSIS — Z20822 Contact with and (suspected) exposure to covid-19: Secondary | ICD-10-CM | POA: Diagnosis not present

## 2019-10-07 DIAGNOSIS — I82452 Acute embolism and thrombosis of left peroneal vein: Secondary | ICD-10-CM | POA: Diagnosis not present

## 2019-10-07 DIAGNOSIS — D696 Thrombocytopenia, unspecified: Secondary | ICD-10-CM | POA: Diagnosis present

## 2019-10-07 DIAGNOSIS — M79662 Pain in left lower leg: Secondary | ICD-10-CM

## 2019-10-07 DIAGNOSIS — I82422 Acute embolism and thrombosis of left iliac vein: Secondary | ICD-10-CM | POA: Diagnosis not present

## 2019-10-07 DIAGNOSIS — F0390 Unspecified dementia without behavioral disturbance: Secondary | ICD-10-CM | POA: Diagnosis present

## 2019-10-07 DIAGNOSIS — N289 Disorder of kidney and ureter, unspecified: Secondary | ICD-10-CM

## 2019-10-07 DIAGNOSIS — I8290 Acute embolism and thrombosis of unspecified vein: Secondary | ICD-10-CM

## 2019-10-07 DIAGNOSIS — D649 Anemia, unspecified: Secondary | ICD-10-CM | POA: Diagnosis present

## 2019-10-07 HISTORY — DX: Personal history of other venous thrombosis and embolism: Z86.718

## 2019-10-07 HISTORY — DX: Unspecified dementia, unspecified severity, without behavioral disturbance, psychotic disturbance, mood disturbance, and anxiety: F03.90

## 2019-10-07 HISTORY — DX: Presence of right artificial knee joint: Z96.651

## 2019-10-07 HISTORY — DX: Unspecified osteoarthritis, unspecified site: M19.90

## 2019-10-07 LAB — CBC WITH DIFFERENTIAL/PLATELET
Abs Immature Granulocytes: 0.05 10*3/uL (ref 0.00–0.07)
Basophils Absolute: 0 10*3/uL (ref 0.0–0.1)
Basophils Relative: 0 %
Eosinophils Absolute: 0 10*3/uL (ref 0.0–0.5)
Eosinophils Relative: 0 %
HCT: 36.3 % (ref 36.0–46.0)
Hemoglobin: 11.8 g/dL — ABNORMAL LOW (ref 12.0–15.0)
Immature Granulocytes: 1 %
Lymphocytes Relative: 7 %
Lymphs Abs: 0.5 10*3/uL — ABNORMAL LOW (ref 0.7–4.0)
MCH: 31.3 pg (ref 26.0–34.0)
MCHC: 32.5 g/dL (ref 30.0–36.0)
MCV: 96.3 fL (ref 80.0–100.0)
Monocytes Absolute: 0.5 10*3/uL (ref 0.1–1.0)
Monocytes Relative: 8 %
Neutro Abs: 5.6 10*3/uL (ref 1.7–7.7)
Neutrophils Relative %: 84 %
Platelets: DECREASED 10*3/uL (ref 150–400)
RBC: 3.77 MIL/uL — ABNORMAL LOW (ref 3.87–5.11)
RDW: 13.2 % (ref 11.5–15.5)
WBC: 6.6 10*3/uL (ref 4.0–10.5)
nRBC: 0 % (ref 0.0–0.2)

## 2019-10-07 LAB — COMPREHENSIVE METABOLIC PANEL
ALT: 10 U/L (ref 0–44)
AST: 17 U/L (ref 15–41)
Albumin: 4 g/dL (ref 3.5–5.0)
Alkaline Phosphatase: 67 U/L (ref 38–126)
Anion gap: 12 (ref 5–15)
BUN: 49 mg/dL — ABNORMAL HIGH (ref 8–23)
CO2: 24 mmol/L (ref 22–32)
Calcium: 9.2 mg/dL (ref 8.9–10.3)
Chloride: 104 mmol/L (ref 98–111)
Creatinine, Ser: 1.97 mg/dL — ABNORMAL HIGH (ref 0.44–1.00)
GFR calc Af Amer: 26 mL/min — ABNORMAL LOW (ref >60)
GFR calc non Af Amer: 23 mL/min — ABNORMAL LOW (ref >60)
Glucose, Bld: 116 mg/dL — ABNORMAL HIGH (ref 70–99)
Potassium: 4.1 mmol/L (ref 3.5–5.1)
Sodium: 140 mmol/L (ref 135–145)
Total Bilirubin: 0.8 mg/dL (ref 0.3–1.2)
Total Protein: 7.3 g/dL (ref 6.5–8.1)

## 2019-10-07 LAB — MRSA PCR SCREENING: MRSA by PCR: NEGATIVE

## 2019-10-07 LAB — PROTIME-INR
INR: 1.2 (ref 0.8–1.2)
Prothrombin Time: 14.3 seconds (ref 11.4–15.2)

## 2019-10-07 LAB — APTT: aPTT: 28 seconds (ref 24–36)

## 2019-10-07 LAB — SARS CORONAVIRUS 2 BY RT PCR (HOSPITAL ORDER, PERFORMED IN ~~LOC~~ HOSPITAL LAB): SARS Coronavirus 2: NEGATIVE

## 2019-10-07 LAB — ECHOCARDIOGRAM COMPLETE
Area-P 1/2: 2.42 cm2
S' Lateral: 2.8 cm

## 2019-10-07 LAB — HEPARIN LEVEL (UNFRACTIONATED): Heparin Unfractionated: <0.1 IU/mL — ABNORMAL LOW (ref 0.30–0.70)

## 2019-10-07 MED ORDER — MEMANTINE HCL ER 7 MG PO CP24
21.0000 mg | ORAL_CAPSULE | Freq: Every day | ORAL | Status: DC
  Filled 2019-10-07 (×2): qty 1

## 2019-10-07 MED ORDER — SODIUM CHLORIDE 0.9% FLUSH
3.0000 mL | Freq: Two times a day (BID) | INTRAVENOUS | Status: DC
  Administered 2019-10-08: 3 mL via INTRAVENOUS

## 2019-10-07 MED ORDER — ACETAMINOPHEN 650 MG RE SUPP: 650.0000 mg | Freq: Four times a day (QID) | RECTAL | Status: DC | PRN

## 2019-10-07 MED ORDER — LISINOPRIL 20 MG PO TABS
20.0000 mg | ORAL_TABLET | Freq: Every day | ORAL | Status: DC
  Administered 2019-10-07 – 2019-10-08 (×2): 20 mg via ORAL
  Filled 2019-10-07 (×2): qty 1

## 2019-10-07 MED ORDER — DONEPEZIL HCL 10 MG PO TABS
10.0000 mg | ORAL_TABLET | Freq: Every day | ORAL | Status: DC
  Administered 2019-10-07 – 2019-10-08 (×2): 10 mg via ORAL
  Filled 2019-10-07 (×2): qty 1

## 2019-10-07 MED ORDER — MEMANTINE HCL ER 14 MG PO CP24
14.0000 mg | ORAL_CAPSULE | Freq: Every day | ORAL | Status: DC
  Filled 2019-10-07: qty 1

## 2019-10-07 MED ORDER — ONDANSETRON HCL 4 MG PO TABS: 4.0000 mg | ORAL_TABLET | Freq: Four times a day (QID) | ORAL | Status: DC | PRN

## 2019-10-07 MED ORDER — ACETAMINOPHEN 325 MG PO TABS: 650.0000 mg | ORAL_TABLET | Freq: Four times a day (QID) | ORAL | Status: DC | PRN

## 2019-10-07 MED ORDER — LACTATED RINGERS IV SOLN
INTRAVENOUS | Status: DC
  Administered 2019-10-08: 75 mL via INTRAVENOUS

## 2019-10-07 MED ORDER — HEPARIN (PORCINE) 25000 UT/250ML-% IV SOLN
1050.0000 [IU]/h | INTRAVENOUS | Status: DC
  Administered 2019-10-08: 1050 [IU]/h via INTRAVENOUS
  Filled 2019-10-07: qty 250

## 2019-10-07 MED ORDER — ONDANSETRON HCL 4 MG/2ML IJ SOLN: 4.0000 mg | Freq: Four times a day (QID) | INTRAMUSCULAR | Status: DC | PRN

## 2019-10-07 MED ORDER — MEMANTINE HCL ER 7 MG PO CP24
21.0000 mg | ORAL_CAPSULE | Freq: Every day | ORAL | Status: DC
  Administered 2019-10-07 – 2019-10-08 (×2): 21 mg via ORAL
  Filled 2019-10-07 (×3): qty 3

## 2019-10-07 NOTE — Plan of Care (Signed)
  Problem: Health Behavior/Discharge Planning: Goal: Ability to manage health-related needs will improve Outcome: Progressing   Problem: Clinical Measurements: Goal: Ability to maintain clinical measurements within normal limits will improve Outcome: Progressing   Problem: Clinical Measurements: Goal: Diagnostic test results will improve Outcome: Progressing   Problem: Clinical Measurements: Goal: Cardiovascular complication will be avoided Outcome: Progressing   Problem: Nutrition: Goal: Adequate nutrition will be maintained Outcome: Progressing   Problem: Elimination: Goal: Will not experience complications related to urinary retention Outcome: Progressing   Problem: Pain Managment: Goal: General experience of comfort will improve Outcome: Progressing   Problem: Skin Integrity: Goal: Risk for impaired skin integrity will decrease Outcome: Progressing

## 2019-10-07 NOTE — Consult Note (Signed)
Medical Consultation   Claudia Taylor  PJA:[id number]  DOB: Aug 16, 1932  DOA: 10/07/2019  PCP: Patient, No Pcp Per   Outpatient Specialists: French Ana - orthopedics; [place] - neurology   Requesting physician: Trula Slade - vascular  Reason for consultation: Orthopedics admitted her and she is in 2C08.  Vascular has seen her and recommends Heparin overnight.  Extensive DVT, does not need intervention.  Needs overnight monitoring for 24 hours without apparent intervention.  Could consider mechanical thrombectomy if she is getting worse.    History of Present Illness: Claudia Taylor is an 86 y.o. female with h/o dementia; h/o non-Hodgkin lymphoma; and HTN presenting with LLE pain.  On DVT US she was found to have a massive DVT of essentially her entire left leg.  Her daughter reports that Monday afternoon she missed a chair and slid to the floor.  The next day they saw that her foot was bruised and swollen.  Xrays were negative and they gave her a knee injection.  They sent her here today to vascular lab.  DVT US was positive for clot from foot to IVC.  PA brought Lovenox over to the hospital and they administered it in the vascular lab.  She was given the Lovenox at [facility].  The leg remains swollen and she complains of her knee hurting.  She does not feel SOB and no CP.  She has a h/o blood clots and was stopped from Coumadin in 2019.  Uncertain about how many lifetime clots she has had.  She has baseline dementia.  She lives independently but occasionally doesn't remember dates, birth dates.  No driving since February.  Less cooking, has assistance with preparing meals.  Has some incontinence, needs assistance with bathing.  She usually uses a cane, worse since the fall with knee pain.   COVID vaccine: NONE - possibly interested in receiving it while here   Review of Systems:  ROS As per HPI otherwise 10 point review of systems negative.    Past Medical  History: Past Medical History:  Diagnosis Date  . Arthritis   . Dementia ([place])   . 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 History of total knee replacement, right    Done in [place]  . Hx of blood clots    patient was on coumadin for years. Granddaughter states that in 2019, patient changes physicians and coumadin was stopped     Past Surgical History: Past Surgical History:  Procedure Laterality Date  . TOTAL KNEE ARTHROPLASTY Right      Allergies:   Allergies  Allergen Reactions  . Sulfa Antibiotics     Other reaction(s): Unknown     Social History:  reports that she has never smoked. She has never used smokeless tobacco. She reports that she does not drink alcohol and does not use drugs.   Family History: Family History  Problem Relation Age of Onset  . COPD Mother       Physical Exam: Vitals:   10/07/19 1529  BP: (!) 144/77  Pulse: 60  Resp: 14  Temp: 98.4 F (36.9 C)  TempSrc: Oral  SpO2: 94%  Weight: 61.8 kg  Height: 5\' 9"  (1.753 m)    Constitutional: Alert and awake, oriented x3, not in any acute distress. Eyes:  EOMI, irises appear normal, anicteric sclera,  ENMT: external ears and nose appear normal, normal hearing, Lips appear normal, oropharynx mucosa, tongue appear normal  Neck: neck appears normal, no masses, normal ROM CVS: S1-S2 clear, no murmur rubs or gallops, normal pedal pulses  Respiratory:  clear to auscultation bilaterally, no wheezing, rales or rhonchi. Respiratory effort normal. No accessory muscle use.  Abdomen: soft nontender, nondistended, normal bowel sounds, no hepatosplenomegaly, no hernias  Musculoskeletal: : diffuse LLE edema with some mottling of foot but normal distal pulses at this time       Neuro: Cranial nerves II-XII grossly intact, normal strength Psych: A&O x 1, pleasant but distant Skin: no rashes or lesions or ulcers, no induration  or nodules other than as above   Data reviewed:  I have personally reviewed the recent labs and imaging studies  Pertinent Labs:   Glucose 116 BUN 49/Creatinine 1.97/GFR 23; 14/0.9/60 in 2018 - none recently WBC 6.6 Hgb 11.8   Inpatient Medications:   Scheduled Meds: Continuous Infusions:   Radiological Exams on Admission: VAS [place] IVC/ILIAC (VENOUS ONLY)  Result Date: 10/07/2019 IVC/ILIAC STUDY Indications: DVT in left lower extremity  Comparison Study: No prior study Performing Technologist: Maudry Mayhew MHA, RDMS, RVT, RDCS  Examination Guidelines: A complete evaluation includes B-mode imaging, spectral Doppler, color Doppler, and power Doppler as needed of all accessible portions of each vessel. Bilateral testing is considered an integral part of a complete examination. Limited examinations for reoccurring indications may be performed as noted.  IVC/Iliac Findings: +----------+------+--------+--------+    IVC    PatentThrombusComments +----------+------+--------+--------+ IVC Prox         acute           +----------+------+--------+--------+ IVC Mid          acute           +----------+------+--------+--------+ IVC Distal       acute           +----------+------+--------+--------+  +-------------------+---------+-----------+---------+-----------+--------+         CIV        RT-PatentRT-ThrombusLT-PatentLT-ThrombusComments +-------------------+---------+-----------+---------+-----------+--------+ Common Iliac Prox   patent                         acute            +-------------------+---------+-----------+---------+-----------+--------+ Common Iliac Mid    patent                         acute            +-------------------+---------+-----------+---------+-----------+--------+ Common Iliac Distal patent                         acute            +-------------------+---------+-----------+---------+-----------+--------+   +-------------------------+---------+-----------+---------+-----------+--------+            EIV           RT-PatentRT-ThrombusLT-PatentLT-ThrombusComments +-------------------------+---------+-----------+---------+-----------+--------+ External Iliac Vein       patent                         acute            Distal                                                                    +-------------------------+---------+-----------+---------+-----------+--------+  Summary: IVC/Iliac: There is evidence of acute thrombus involving the IVC, throughout its entire length. There is no evidence of thrombus involving the right common iliac vein. There is evidence of acute thrombus involving the left common iliac vein. There is no evidence of thrombus involving the right external iliac vein. There is evidence of acute thrombus involving the left external iliac vein.  *See table(s) above for measurements and observations.  Electronically signed by Harold Barban MD on 10/07/2019 at 5:21:30 PM.    Final    VAS [place] LOWER EXTREMITY VENOUS (DVT)  Result Date: 10/07/2019  Lower Venous DVTStudy Indications: S/p fall 10/05/2019, right lower extremity swelling and knee pain.  Comparison Study: No prior study. Performing Technologist: Maudry Mayhew MHA, RDMS, RVT, RDCS  Examination Guidelines: A complete evaluation includes B-mode imaging, spectral Doppler, color Doppler, and power Doppler as needed of all accessible portions of each vessel. Bilateral testing is considered an integral part of a complete examination. Limited examinations for reoccurring indications may be performed as noted. The reflux portion of the exam is performed with the patient in reverse Trendelenburg.  +-----+---------------+---------+-----------+----------+--------------+ RIGHTCompressibilityPhasicitySpontaneityPropertiesThrombus Aging +-----+---------------+---------+-----------+----------+--------------+ CFV  Full            Yes      Yes                                 +-----+---------------+---------+-----------+----------+--------------+   +---------+---------------+---------+-----------+-----------------+------------+ LEFT     CompressibilityPhasicitySpontaneityProperties       Thrombus                                                                  Aging        +---------+---------------+---------+-----------+-----------------+------------+ CFV      None                    No                          Acute        +---------+---------------+---------+-----------+-----------------+------------+ SFJ      None                                                Acute        +---------+---------------+---------+-----------+-----------------+------------+ FV Prox  None                                                Acute        +---------+---------------+---------+-----------+-----------------+------------+ FV Mid   None                                                Acute        +---------+---------------+---------+-----------+-----------------+------------+ FV DistalNone  Acute        +---------+---------------+---------+-----------+-----------------+------------+ PFV      None                                                Acute        +---------+---------------+---------+-----------+-----------------+------------+ POP      None           No       Yes        partially        Acute                                                    re-cannalized                 +---------+---------------+---------+-----------+-----------------+------------+ PTV      None                    No                          Acute        +---------+---------------+---------+-----------+-----------------+------------+ PERO     None                    No                          Acute         +---------+---------------+---------+-----------+-----------------+------------+ Gastroc  None                    No                          Acute        +---------+---------------+---------+-----------+-----------------+------------+     Summary: RIGHT: - No evidence of common femoral vein obstruction.  LEFT: - Findings consistent with acute deep vein thrombosis involving the left common femoral vein, SF junction, left femoral vein, left proximal profunda vein, left popliteal vein, left posterior tibial veins, left peroneal veins, and left gastrocnemius veins. - No cystic structure found in the popliteal fossa.  *See table(s) above for measurements and observations. Electronically signed by Harold Barban MD on 10/07/2019 at 5:21:24 PM.    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[place]) Active Problems:   Hx of blood clots   Dementia ([place])   Renal dysfunction   DNR (do not resuscitate)  VTE -Patient with at least 1 prior episode of thromboembolic disease presenting with extensive new DVT of LLE from foot to IVC -This was diagnosed today in the vascular lab and so decision was made for direct admission -She was given a dose of treatment-dose Lovenox by ortho PA while in the vascular lab -She was seen by vascular surgery, who recommended observation without thrombectomy and IV heparin overnight -Patient was admitted to progressive care unit by orthopedics -Initiate anticoagulation - for now, will start treatment-dose heparin -She is likely able to transition to alternative [facility] agent tomorrow -Given prior h/o clot, she will need lifelong AC -The patient understands that thromboembolic disease can be catastrophic and even deadly  and that she must be complaint with physician appointments and lifelong anticoagulation.  Renal dysfunction -Uncertain baseline renal function - normal in 2018, abnormal now, and no records since -Will gently hydrate overnight and  recheck in AM  Dementia -Continue Aricept, Namenda  HTN -Continue Lisinopril  DNR -I have discussed code status with the patient and her granddaughter and  they are in agreement that the patient would not desire resuscitation and would prefer to die a natural death should that situation arise. -She will need a gold out of facility DNR form at the time of discharge   Thank you for this consultation.  Our [facility] hospitalist team will follow the patient with you.   Time Spent: 60 minutes     Karmen Bongo M.D. [facility] 10/07/2019, 5:47 PM

## 2019-10-07 NOTE — Progress Notes (Signed)
  Echocardiogram 2D Echocardiogram has been performed.  Claudia Taylor 10/07/2019, 12:18 PM

## 2019-10-07 NOTE — Consult Note (Signed)
Vascular and Vein Specialist of [facility]  Patient name: Claudia Taylor MRN: [id number] DOB: 12-23-1932 Sex: female   REQUESTING PROVIDER:    Dr. Noemi Chapel   REASON FOR CONSULT:    DVT  HISTORY OF PRESENT ILLNESS:   Claudia Taylor is a 86 y.o. female, who I have been asked to evaluate for a left leg DVT. Patient suffers from mild dementia and is accompanied by her granddaughter. Apparently the patient fell 2 days ago and noted left leg swelling. She was seen by Dr. French Ana who injected the left knee and ordered a venous ultrasound to rule out DVT. Ultrasound was positive for DVT. There was nonocclusive thrombus within the inferior vena cava and then thrombus within the iliofemoral and femoral-popliteal segments. The patient has received a dose of Lovenox, 60 mg.  The patient has had a blood clot in the past. She was on Coumadin up until 2019 when it was discontinued by her primary care physician. The patient also has a history of small cell non-Hodgkin's lymphoma. She is a never smoker.  PAST MEDICAL HISTORY    Past Medical History:  Diagnosis Date  . Arthritis   . Dementia ([place])   . 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 History of total knee replacement, right    Done in [place]  . Hx of blood clots    patient was on coumadin for years. Granddaughter states that in 2019, patient changes physicians and coumadin was stopped      FAMILY HISTORY   Family History  Problem Relation Age of Onset  . COPD Mother     SOCIAL HISTORY:   Social History   Socioeconomic History  . Marital status: Widowed    Spouse name: Not on file  . Number of children: Not on file  . Years of education: Not on file  . Highest education level: Not on file  Occupational History  . Not on file  Tobacco Use  . Smoking status: Never Smoker  . Smokeless tobacco:  Never Used  Substance and Sexual Activity  . Alcohol use: Never  . Drug use: Never  . Sexual activity: Not Currently  Other Topics Concern  . Not on file  Social History Narrative   Granddaughter  Marlowe Sax is care taker  [phone]   Social Determinants of Health   Financial Resource Strain:   . Difficulty of Paying Living Expenses: Not on file  Food Insecurity:   . Worried About Charity fundraiser in the Last Year: Not on file  . Ran Out of Food in the Last Year: Not on file  Transportation Needs:   . Lack of Transportation (Medical): Not on file  . Lack of Transportation (Non-Medical): Not on file  Physical Activity:   . Days of Exercise per Week: Not on file  . Minutes of Exercise per Session: Not on file  Stress:   . Feeling of Stress : Not on file  Social Connections:   . Frequency of Communication with Friends and Family: Not on file  . Frequency of Social Gatherings with Friends and Family: Not on file  . Attends Religious Services: Not on file  . Active Member of Clubs or Organizations: Not on file  . Attends Archivist Meetings: Not on file  . Marital Status: Not on file  Intimate Partner Violence:   . Fear of Current or  Ex-Partner: Not on file  . Emotionally Abused: Not on file  . Physically Abused: Not on file  . Sexually Abused: Not on file    ALLERGIES:    Allergies  Allergen Reactions  . Sulfa Antibiotics     Other reaction(s): Unknown    CURRENT MEDICATIONS:    No current facility-administered medications for this encounter.    REVIEW OF SYSTEMS:   [X]  denotes positive finding, [ ]  denotes negative finding Cardiac  Comments:  Chest pain or chest pressure:    Shortness of breath upon exertion:    Short of breath when lying flat:    Irregular heart rhythm:        Vascular    Pain in calf, thigh, or hip brought on by ambulation:    Pain in feet at night that wakes you up from your sleep:     Blood clot in your veins:      Leg swelling:  x       Pulmonary    Oxygen at home:    Productive cough:     Wheezing:         Neurologic    Sudden weakness in arms or legs:     Sudden numbness in arms or legs:     Sudden onset of difficulty speaking or slurred speech:    Temporary loss of vision in one eye:     Problems with dizziness:         Gastrointestinal    Blood in stool:      Vomited blood:         Genitourinary    Burning when urinating:     Blood in urine:        Psychiatric    Major depression:         Hematologic    Bleeding problems:    Problems with blood clotting too easily:        Skin    Rashes or ulcers:        Constitutional    Fever or chills:     PHYSICAL EXAM:   Vitals:   10/07/19 1529  Pulse: (P) 60  Temp: (P) 98.4 F (36.9 C)  TempSrc: (P) Oral  SpO2: (P) 94%    GENERAL: The patient is a well-nourished female, in no acute distress. The vital signs are documented above. CARDIAC: There is a regular rate and rhythm.  VASCULAR: Left leg edema, 2+. Palpable dorsalis pedis and posterior tibial pulses. No signs of phlegmasia. ABDOMEN: Soft and non-tender with normal pitched bowel sounds.  MUSCULOSKELETAL: There are no major deformities or cyanosis. NEUROLOGIC: No focal weakness or paresthesias are detected. SKIN: There are no ulcers or rashes noted. PSYCHIATRIC: The patient has a normal affect.  STUDIES:   I have reviewed the following venous Doppler studies: Lower extremity: - Findings consistent with acute deep vein thrombosis involving the left  common femoral vein, SF junction, left femoral vein, left proximal  profunda vein, left popliteal vein, left posterior tibial veins, left  peroneal veins, and left gastrocnemius  veins.   IVC/iliac: IVC/Iliac: There is evidence of acute thrombus involving the IVC,  throughout its entire length.  There is no evidence of thrombus involving the right common iliac vein.  There is evidence of acute thrombus involving  the left common iliac vein.  There is no evidence of thrombus involving the right external iliac vein.  There is evidence of acute thrombus involving the left external iliac  vein.  ASSESSMENT and PLAN   Extensive left leg DVT: This is likely secondary to her trauma, however with her history of DVT, she could easily have some underlying coagulopathy. Regardless, she needs to be anticoagulated to prevent clot propagation. She is to be admitted to the hospital for IV heparin. She potentially would be a candidate for mechanical thrombolysis, however with her mild dementia and her advanced age, I would like to see if we can treat her with anticoagulation alone. I will monitor her for 24 hours and then consider sending her home with a DOAC and short interval follow-up to evaluate her symptoms.   Leia Alf, MD, FACS [facility] [phone] Pager [phone]

## 2019-10-07 NOTE — H&P (Signed)
Claudia Taylor is an 86 y.o. female.   Chief Complaint: left leg and inferior vena cava thrombosis HPI: 81 yowf patient was seen by Dr French Ana on Tuesday for new onset left leg swelling after a fall on Monday.  Dr French Ana did not find any fractures, he injected her arthritic left knee and ordered a venous duplex ultrasound to evaluate for a DVT.  I received a call Tuesday AM from the vascular lab with results since they were unable to reach Dr French Ana or his PA.  I paged the hospitalist and Vascular Surgeon on call.  The results of the doppler showed a massive DVT involving the whole left leg, the common iliacs, and the inferior vena cava.  I discussed these results with Dr Lorin Mercy who suggested I speak to the ER charge nurse.  On a conference call with me, Dr Lorin Mercy ([facility]), and the charge nurse, Dr Lorin Mercy explained that she would not see the patient until it had completely worked up in the [place].  The triage nurse stated there were no beds available for the ER.  I spoke to Dr Trula Slade who also understands that there are no beds available in the hospital.  He will come see the patient in Heart and Vascular Lab Echo Room to determine the best vascular care.  While waiting, patient has been given 60 mg of lovenox sub q, ordered by me after discussing with Dr Trula Slade.  Dr Trula Slade will decide when to transition to Xa inhibitors (eliquis vs xarelto) and if surgical intervention is appropriate.    Past Medical History:  Diagnosis Date  . Arthritis   . Dementia ([place])   . 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 Hx of blood clots    patient was on coumadin for years. Granddaughter states that in 2019, patient changes physicians and coumadin was stopped     History reviewed. No pertinent surgical history.  Family History  Problem Relation Age of Onset  . COPD Mother    Social History:  reports that she has never smoked.  She has never used smokeless tobacco. She reports that she does not drink alcohol and does not use drugs.  Allergies:  Allergies  Allergen Reactions  . Sulfa Antibiotics     Other reaction(s): Unknown    Medications Prior to Admission  Medication Sig Dispense Refill  . Docusate Sodium (DSS) 100 MG CAPS Take 1 tablet by mouth. 1 tablet 1 or 2 times a day as needed for constipation    . donepezil (ARICEPT) 10 MG tablet Take 10 mg by mouth daily.    Marland Kitchen ipratropium (ATROVENT) 0.06 % nasal spray SMARTSIG:2 Spray(s) Both Nares 3 Times Daily PRN    . lisinopril (ZESTRIL) 20 MG tablet Take 1 tablet by mouth daily.    . memantine (NAMENDA XR) 14 MG CP24 24 hr capsule Take 14 mg by mouth at bedtime.    . cyanocobalamin (,VITAMIN B-12,) 1000 MCG/ML injection Inject 1 mL into the muscle every 30 (thirty) days.      Results for orders placed or performed during the hospital encounter of 10/07/19 (from the past 48 hour(s))  CBC with Differential/Platelet     Status: Abnormal   Collection Time: 10/07/19  1:07 PM  Result Value Ref Range   WBC 6.6 4.0 - 10.5 K/uL   RBC 3.77 (L) 3.87 - 5.11 MIL/uL   Hemoglobin 11.8 (L)  12.0 - 15.0 g/dL   HCT 36.3 36 - 46 %   MCV 96.3 80.0 - 100.0 fL   MCH 31.3 26.0 - 34.0 pg   MCHC 32.5 30.0 - 36.0 g/dL   RDW 13.2 11.5 - 15.5 %   Platelets  150 - 400 K/uL    PLATELET CLUMPS NOTED ON SMEAR, COUNT APPEARS DECREASED    Comment: Immature Platelet Fraction may be clinically indicated, consider ordering this additional test [id number]    nRBC 0.0 0.0 - 0.2 %   Neutrophils Relative % 84 %   Neutro Abs 5.6 1.7 - 7.7 K/uL   Lymphocytes Relative 7 %   Lymphs Abs 0.5 (L) 0.7 - 4.0 K/uL   Monocytes Relative 8 %   Monocytes Absolute 0.5 0 - 1 K/uL   Eosinophils Relative 0 %   Eosinophils Absolute 0.0 0 - 0 K/uL   Basophils Relative 0 %   Basophils Absolute 0.0 0 - 0 K/uL   Immature Granulocytes 1 %   Abs Immature Granulocytes 0.05 0.00 - 0.07 K/uL   Ovalocytes  PRESENT     Comment: Performed at [facility], [address], [place], [place] [address]  Comprehensive metabolic panel     Status: Abnormal   Collection Time: 10/07/19  1:07 PM  Result Value Ref Range   Sodium 140 135 - 145 mmol/L   Potassium 4.1 3.5 - 5.1 mmol/L   Chloride 104 98 - 111 mmol/L   CO2 24 22 - 32 mmol/L   Glucose, Bld 116 (H) 70 - 99 mg/dL    Comment: Glucose reference range applies only to samples taken after fasting for at least 8 hours.   BUN 49 (H) 8 - 23 mg/dL   Creatinine, Ser 1.97 (H) 0.44 - 1.00 mg/dL   Calcium 9.2 8.9 - 10.3 mg/dL   Total Protein 7.3 6.5 - 8.1 g/dL   Albumin 4.0 3.5 - 5.0 g/dL   AST 17 15 - 41 U/L   ALT 10 0 - 44 U/L   Alkaline Phosphatase 67 38 - 126 U/L   Total Bilirubin 0.8 0.3 - 1.2 mg/dL   GFR calc non Af Amer 23 (L) >60 mL/min   GFR calc Af Amer 26 (L) >60 mL/min   Anion gap 12 5 - 15    Comment: Performed at [facility] [address] [place], [place] [address]  Protime-INR     Status: None   Collection Time: 10/07/19  1:07 PM  Result Value Ref Range   Prothrombin Time 14.3 11.4 - 15.2 seconds   INR 1.2 0.8 - 1.2    Comment: (NOTE) INR goal varies based on device and disease states. Performed at [facility], [place] [address], [place], [place] [address]   APTT     Status: None   Collection Time: 10/07/19  1:07 PM  Result Value Ref Range   aPTT 28 24 - 36 seconds    Comment: Performed at [facility] [address], [place], [place] [address]  Heparin level (unfractionated)     Status: Abnormal   Collection Time: 10/07/19  1:07 PM  Result Value Ref Range   Heparin Unfractionated <0.10 (L) 0.30 - 0.70 IU/mL    Comment: (NOTE) If heparin results are below expected values, and patient dosage has  been confirmed, suggest follow up testing of antithrombin III levels. Performed at [facility], [place] [address] [place], [place] [address]  VAS [place] IVC/ILIAC (VENOUS ONLY)  Result Date:  10/07/2019 IVC/ILIAC STUDY Indications: DVT in left lower extremity  Comparison Study: No prior study Performing Technologist: Maudry Mayhew MHA, RDMS, RVT, RDCS  Examination Guidelines: A complete evaluation includes B-mode imaging, spectral Doppler, color Doppler, and power Doppler as needed of all accessible portions of each vessel. Bilateral testing is considered an integral part of a complete examination. Limited examinations for reoccurring indications may be performed as noted.  IVC/Iliac Findings: +----------+------+--------+--------+    IVC    PatentThrombusComments +----------+------+--------+--------+ IVC Prox         acute           +----------+------+--------+--------+ IVC Mid          acute           +----------+------+--------+--------+ IVC Distal       acute           +----------+------+--------+--------+  +-------------------+---------+-----------+---------+-----------+--------+         CIV        RT-PatentRT-ThrombusLT-PatentLT-ThrombusComments +-------------------+---------+-----------+---------+-----------+--------+ Common Iliac Prox   patent                         acute            +-------------------+---------+-----------+---------+-----------+--------+ Common Iliac Mid    patent                         acute            +-------------------+---------+-----------+---------+-----------+--------+ Common Iliac Distal patent                         acute            +-------------------+---------+-----------+---------+-----------+--------+  +-------------------------+---------+-----------+---------+-----------+--------+            EIV           RT-PatentRT-ThrombusLT-PatentLT-ThrombusComments +-------------------------+---------+-----------+---------+-----------+--------+ External Iliac Vein       patent                         acute            Distal                                                                     +-------------------------+---------+-----------+---------+-----------+--------+   Summary: IVC/Iliac: There is evidence of acute thrombus involving the IVC, throughout its entire length. There is no evidence of thrombus involving the right common iliac vein. There is evidence of acute thrombus involving the left common iliac vein. There is no evidence of thrombus involving the right external iliac vein. There is evidence of acute thrombus involving the left external iliac vein.  *See table(s) above for measurements and observations.   Preliminary    VAS [place] LOWER EXTREMITY VENOUS (DVT)  Result Date: 10/07/2019  Lower Venous DVTStudy Indications: Swelling.  Comparison Study: No prior study. Performing Technologist: Maudry Mayhew MHA, RDMS, RVT, RDCS  Examination Guidelines: A complete evaluation includes B-mode imaging, spectral Doppler, color Doppler, and power Doppler as needed of all accessible portions of each vessel. Bilateral testing is considered an integral part of a complete examination. Limited examinations for reoccurring indications may be performed as  noted. The reflux portion of the exam is performed with the patient in reverse Trendelenburg.  +-----+---------------+---------+-----------+----------+--------------+ RIGHTCompressibilityPhasicitySpontaneityPropertiesThrombus Aging +-----+---------------+---------+-----------+----------+--------------+ CFV  Full           Yes      Yes                                 +-----+---------------+---------+-----------+----------+--------------+   +---------+---------------+---------+-----------+-----------------+------------+ LEFT     CompressibilityPhasicitySpontaneityProperties       Thrombus                                                                  Aging        +---------+---------------+---------+-----------+-----------------+------------+ CFV      None                    No                          Acute         +---------+---------------+---------+-----------+-----------------+------------+ SFJ      None                                                Acute        +---------+---------------+---------+-----------+-----------------+------------+ FV Prox  None                                                Acute        +---------+---------------+---------+-----------+-----------------+------------+ FV Mid   None                                                Acute        +---------+---------------+---------+-----------+-----------------+------------+ FV DistalNone                                                Acute        +---------+---------------+---------+-----------+-----------------+------------+ PFV      None                                                Acute        +---------+---------------+---------+-----------+-----------------+------------+ POP      None           No       Yes        partially        Acute  re-cannalized                 +---------+---------------+---------+-----------+-----------------+------------+ PTV      None                    No                          Acute        +---------+---------------+---------+-----------+-----------------+------------+ PERO     None                    No                          Acute        +---------+---------------+---------+-----------+-----------------+------------+ Gastroc  None                    No                          Acute        +---------+---------------+---------+-----------+-----------------+------------+     Summary: RIGHT: - No evidence of common femoral vein obstruction.  LEFT: - Findings consistent with acute deep vein thrombosis involving the left common femoral vein, SF junction, left femoral vein, left proximal profunda vein, left popliteal vein, left posterior tibial veins, left peroneal veins, and left gastrocnemius  veins. - No cystic structure found in the popliteal fossa.  *See table(s) above for measurements and observations.    Preliminary     Review of Systems  Constitutional: Positive for activity change.  HENT: Negative.   Eyes: Negative.   Respiratory: Negative.  Negative for shortness of breath.   Cardiovascular: Positive for leg swelling. Negative for chest pain.  Gastrointestinal: Negative.   Endocrine: Negative.   Genitourinary: Negative.   Musculoskeletal: Negative.   Allergic/Immunologic: Negative.   Neurological: Negative.   Hematological: Negative.   Psychiatric/Behavioral: Negative.     There were no vitals taken for this visit. Physical Exam Constitutional:      Appearance: Normal appearance. She is normal weight.  HENT:     Head: Normocephalic and atraumatic.     Right Ear: External ear normal.     Left Ear: External ear normal.     Nose: Nose normal.     Mouth/Throat:     Mouth: Mucous membranes are moist.     Pharynx: Oropharynx is clear.  Eyes:     Conjunctiva/sclera: Conjunctivae normal.  Cardiovascular:     Pulses: Normal pulses.  Pulmonary:     Effort: Pulmonary effort is normal.  Abdominal:     Palpations: Abdomen is soft.  Genitourinary:    Comments: Not pertinent to current symptomatology therefore not examined. Musculoskeletal:     Cervical back: Neck supple.     Comments: Left lower leg swollen no redness, knee pain improved since cortisone injection yesterday,  2+ DP pulses bilaterally.    Skin:    General: Skin is warm and dry.     Capillary Refill: Capillary refill takes less than 2 seconds.  Neurological:     General: No focal deficit present.     Mental Status: She is alert.  Psychiatric:        Mood and Affect: Mood normal.        Behavior: Behavior normal.      Assessment Principal Problem:   IVC thrombosis (HCC) Active Problems:   Hx  of blood clots   Dementia [facility])   Plan Admit and Dr Trula Slade will direct her care  Linda Hedges, PA-C 10/07/2019, 2:28 PM

## 2019-10-07 NOTE — Plan of Care (Signed)
  Problem: Education: Goal: Knowledge of General Education information will improve Description: Including pain rating scale, medication(s)/side effects and non-pharmacologic comfort measures Outcome: Progressing   Problem: Nutrition: Goal: Adequate nutrition will be maintained Outcome: Progressing   Problem: Coping: Goal: Level of anxiety will decrease Outcome: Progressing   

## 2019-10-07 NOTE — Progress Notes (Signed)
IVC/ iliac vein duplex and left lower extremity venous duplex completed. Refer to "CV Proc" under chart review to view preliminary results.  Preliminary results discussed with Luna Glasgow, PA-C.  10/07/2019 10:49 AM Kelby Aline., MHA, RVT, RDCS, RDMS

## 2019-10-07 NOTE — Progress Notes (Signed)
ANTICOAGULATION CONSULT NOTE - Initial Consult  Pharmacy Consult for Heparin Indication: DVT  Allergies  Allergen Reactions  . Sulfa Antibiotics     Other reaction(s): Unknown    Patient Measurements: Height: 5\' 9"  (175.3 cm) Weight: 61.8 kg (136 lb 3.9 oz) IBW/kg (Calculated) : 66.2 Heparin Dosing Weight: 61.8 kg  Vital Signs: Temp: 98.4 F (36.9 C) (09/15 1529) Temp Source: Oral (09/15 1529) BP: 144/77 (09/15 1529) Pulse Rate: 60 (09/15 1529)  Labs: Recent Labs    10/07/19 1307  HGB 11.8*  HCT 36.3  PLT PLATELET CLUMPS NOTED ON SMEAR, COUNT APPEARS DECREASED  APTT 28  LABPROT 14.3  INR 1.2  HEPARINUNFRC <0.10*  CREATININE 1.97*    Estimated Creatinine Clearance: 20.4 mL/min (A) (by C-G formula based on SCr of 1.97 mg/dL (H)).   Medical History: Past Medical History:  Diagnosis Date  . Arthritis   . Dementia ([place])   . Diffuse small cell non-Hodgkin's lymphoma ([place]) 2010   still has port but has been in remission a few years   . History of total knee replacement, right    Done in [place]  . Hx of blood clots    patient was on coumadin for years. Granddaughter states that in 2019, patient changes physicians and coumadin was stopped     Assessment: 86 y.o. female, being evaluated for a left leg DVT. Ultrasound was positive for DVT. There was nonocclusive thrombus within the inferior vena cava and then thrombus within the iliofemoral and femoral-popliteal segments.  Pharmacy is consulted to initiate heparin.  Lovenox 60 mg given in the ED.   Goal of Therapy:  Heparin level 0.3-0.7 units/ml Monitor platelets by anticoagulation protocol: Yes   Plan:  Start heparin infusion at 1050 units/hr approximately 12 hours after Lovenox dose @ 0200 Check anti-Xa level in 8 hours and daily while on heparin Continue to monitor H&H and platelets  Alanda Slim, PharmD, [facility] Clinical Pharmacist Please see AMION for all Pharmacists' Contact Phone Numbers 10/07/2019,  4:05 PM

## 2019-10-07 NOTE — Progress Notes (Signed)
Patient present with port a cath.  Daughter states last accessed over four years and have not been maintained.  Port is therefore a non functioning access.  Will maintain PIV for access at this time.

## 2019-10-08 DIAGNOSIS — N179 Acute kidney failure, unspecified: Secondary | ICD-10-CM

## 2019-10-08 DIAGNOSIS — D649 Anemia, unspecified: Secondary | ICD-10-CM

## 2019-10-08 DIAGNOSIS — I8290 Acute embolism and thrombosis of unspecified vein: Secondary | ICD-10-CM

## 2019-10-08 DIAGNOSIS — I82220 Acute embolism and thrombosis of inferior vena cava: Principal | ICD-10-CM

## 2019-10-08 DIAGNOSIS — C8292 Follicular lymphoma, unspecified, intrathoracic lymph nodes: Secondary | ICD-10-CM

## 2019-10-08 DIAGNOSIS — D696 Thrombocytopenia, unspecified: Secondary | ICD-10-CM

## 2019-10-08 DIAGNOSIS — I82402 Acute embolism and thrombosis of unspecified deep veins of left lower extremity: Secondary | ICD-10-CM

## 2019-10-08 LAB — CBC
HCT: 30 % — ABNORMAL LOW (ref 36.0–46.0)
Hemoglobin: 10 g/dL — ABNORMAL LOW (ref 12.0–15.0)
MCH: 31.7 pg (ref 26.0–34.0)
MCHC: 33.3 g/dL (ref 30.0–36.0)
MCV: 95.2 fL (ref 80.0–100.0)
Platelets: 84 10*3/uL — ABNORMAL LOW (ref 150–400)
RBC: 3.15 MIL/uL — ABNORMAL LOW (ref 3.87–5.11)
RDW: 13.2 % (ref 11.5–15.5)
WBC: 5.2 10*3/uL (ref 4.0–10.5)
nRBC: 0 % (ref 0.0–0.2)

## 2019-10-08 LAB — BASIC METABOLIC PANEL
Anion gap: 9 (ref 5–15)
BUN: 48 mg/dL — ABNORMAL HIGH (ref 8–23)
CO2: 22 mmol/L (ref 22–32)
Calcium: 8.5 mg/dL — ABNORMAL LOW (ref 8.9–10.3)
Chloride: 107 mmol/L (ref 98–111)
Creatinine, Ser: 1.76 mg/dL — ABNORMAL HIGH (ref 0.44–1.00)
GFR calc Af Amer: 30 mL/min — ABNORMAL LOW (ref >60)
GFR calc non Af Amer: 26 mL/min — ABNORMAL LOW (ref >60)
Glucose, Bld: 114 mg/dL — ABNORMAL HIGH (ref 70–99)
Potassium: 4.7 mmol/L (ref 3.5–5.1)
Sodium: 138 mmol/L (ref 135–145)

## 2019-10-08 LAB — HEPARIN LEVEL (UNFRACTIONATED): Heparin Unfractionated: 0.41 IU/mL (ref 0.30–0.70)

## 2019-10-08 MED ORDER — APIXABAN 5 MG PO TABS
10.0000 mg | ORAL_TABLET | Freq: Two times a day (BID) | ORAL | Status: DC
  Administered 2019-10-08 (×2): 10 mg via ORAL
  Filled 2019-10-08 (×2): qty 2

## 2019-10-08 MED ORDER — APIXABAN 5 MG PO TABS: 5.0000 mg | ORAL_TABLET | Freq: Two times a day (BID) | ORAL | Status: DC

## 2019-10-08 NOTE — TOC Benefit Eligibility Note (Signed)
Transition of Care [facility]) Benefit Eligibility Note    Patient Details  Name: Asucena Galer MRN: [id number] Date of Birth: 28-Feb-1932   Medication/Dose: Enoxparin (Lovenox) 60mg . Eliquis 5mg .  Covered?: Yes Jennye Moccasin not covered)  Tier:  (?)  Prescription Coverage Preferred Pharmacy: [facility]  Spoke with Person/Company/Phone Number:: Mary C. W[employer] rx Help Desk [phone]  Co-Pay: $45.00 bid for 30days,  $5.37 sub.q daily  Prior Approval: No  Deductible:  (No Deductible)       Shelda Altes Phone Number: 10/08/2019, 11:10 AM

## 2019-10-08 NOTE — Progress Notes (Addendum)
PROGRESS NOTE  Claudia Taylor QVZ:[id number] DOB: [birth year] DOA: 10/07/2019 PCP: Ernestene Kiel, MD  Brief History / Follow-up consult for medical recommendations  31yow seen by orthopedics outpt for leg swelling; outpt doppler showed massive DVT. Pt admitted by orthopedics and both VVS and TRH consulted for recommendations. VVS recommeded observation w/o thrombectomy and IV heparin.    A & P  IVC thrombosis (HCC) --management as per VVS, w/ rec for oral anticoagulation, compression hose and outpt follow-up  Left leg DVT ([place]) --treatment as per IVC thrombosis  AKI --per PCP office [facility] last BUN 21, Creatinine 1.14 8/27. --BUN and creatinine better today at 48/1.76 --prerenal pattern suggests dehydration, consistent also w/ history per granddaughter and with improvement in-house --given improvement already, suspect return to normal spontaneously; d/w granddaughter to push fluids --needs close outpt follow-up w/ PCP (arranged for one week, suggest check BMP and CBC --hold lisinopril  Thrombocytopenia (HCC) --acute, secondary to massive DVT --Plts 145 on August 27 per PCP office --anticipate will improve spontaneously  --close outpt f/u 1 week to check CBC --no bleeding; benefit of anticoagulation outweighs risk at this point --I discussed w/ granddaughter at bedside  Normocytic anemia --dilution noted here, no bleeding noted --close outpt followup CBC in 1 week recommended  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 Bobby Rumpf oncologist in [place], pt in remission --however, he will plan to see in office given new VTE and will consider further workup  Dementia ([place]) --stable, continue Aricept and Namenda  Disposition Plan:  Discussion: ok to d/c home from medical standpoint, I notified primary team  DVT prophylaxis:   apixaban (ELIQUIS) tablet 10 mg  apixaban (ELIQUIS) tablet 5 mg   Code Status:  DNR Family Communication: discussed with granddaughter in detail  Claudia Hodgkins, MD  [facility] Direct contact: see [email or web address] (further directions at bottom of note if needed) 7PM-7AM contact night coverage as at bottom of note 10/08/2019, 2:32 PM  LOS: 1 day    Interval History/Subjective  Feels fine, no complaints. No pain, breathing fine. Granddaughter/caregiver at bedside, reports pt was dehydrated on admission.  Objective   Vitals:  Vitals:   10/08/19 1126 10/08/19 1300  BP:  (!) 100/58  Pulse:  (!) 53  Resp: 17 14  Temp:    SpO2: 97% 100%    Exam:  Constitutional:   . Appears calm and comfortable ENMT:  . grossly normal hearing  Respiratory:  . CTA bilaterally, no w/r/r.  . Respiratory effort normal.  Cardiovascular:  . RRR, no m/r/g . No RLE extremity edema   . LLE edema 1 + . DP pulse RLE 2+ Skin:  . No significant abnormalities noted Psychiatric:  . Mental status o Mood, affect appropriate  I have personally reviewed the following:   Today's Data  . Creatinine 1.94 > 1.76 . BUN 49 > 48 . Plts 84 . Hgb 10.0  Scheduled Meds: . apixaban  10 mg Oral BID   Followed by  . [START ON 10/15/2019] apixaban  5 mg Oral BID  . donepezil  10 mg Oral Daily  . lisinopril  20 mg Oral Daily  . memantine  21 mg Oral Daily  . sodium chloride flush  3 mL Intravenous Q12H   Continuous Infusions: . 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  1. Check the care team in [facility] and look for a) attending/consulting  TRH provider listed and b) the [facility] team listed 2. Log into [email or web address] and use [place]'s universal password to access. If you do not have the password, please contact the hospital operator. 3. Locate the [facility] provider you are looking for under [facility] and page to a number that you can be directly reached. 4. If you still have difficulty reaching the provider, please page the [facility] (Director on Call) for the Hospitalists listed on amion for assistance.

## 2019-10-08 NOTE — Discharge Instructions (Signed)
Information on my medicine - ELIQUIS (apixaban)  Why was Eliquis prescribed for you? Eliquis was prescribed to treat blood clots that may have been found in the veins of your legs (deep vein thrombosis) or in your lungs (pulmonary embolism) and to reduce the risk of them occurring again.  What do You need to know about Eliquis ? The starting dose is 10 mg (two 5 mg tablets) taken TWICE daily for the FIRST SEVEN (7) DAYS, then on 10/15/2019  the dose is reduced to ONE 5 mg tablet taken TWICE daily.  Eliquis may be taken with or without food.   Try to take the dose about the same time in the morning and in the evening. If you have difficulty swallowing the tablet whole please discuss with your pharmacist how to take the medication safely.  Take Eliquis exactly as prescribed and DO NOT stop taking Eliquis without talking to the doctor who prescribed the medication.  Stopping may increase your risk of developing a new blood clot.  Refill your prescription before you run out.  After discharge, you should have regular check-up appointments with your healthcare provider that is prescribing your Eliquis.    What do you do if you miss a dose? If a dose of ELIQUIS is not taken at the scheduled time, take it as soon as possible on the same day and twice-daily administration should be resumed. The dose should not be doubled to make up for a missed dose.  Important Safety Information A possible side effect of Eliquis is bleeding. You should call your healthcare provider right away if you experience any of the following: ? Bleeding from an injury or your nose that does not stop. ? Unusual colored urine (red or dark brown) or unusual colored stools (red or black). ? Unusual bruising for unknown reasons. ? A serious fall or if you hit your head (even if there is no bleeding).  Some medicines may interact with Eliquis and might increase your risk of bleeding or clotting while on Eliquis. To help  avoid this, consult your healthcare provider or pharmacist prior to using any new prescription or non-prescription medications, including herbals, vitamins, non-steroidal anti-inflammatory drugs (NSAIDs) and supplements.  This website has more information on Eliquis (apixaban): http://www.eliquis.com/eliquis/home

## 2019-10-08 NOTE — Progress Notes (Signed)
    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 I think she needs a trial of oral anticoagulation and compression.  I will see her in the office in 1 week and re-evaluate her.  If she has gotten worse, I will consider mechanical thrombectomy.    I am ordering 20-30 mmHG thigh high compression which she should wear during the day, and have placed prior to discharge  Thrombocytopenia:  No baseline platelet count.  No signs of HIT  Anticipate d/c today with DOAC and compression stockings.  Wells Yossef Gilkison 10/08/2019 8:51 AM --  Vitals:   10/08/19 0333 10/08/19 0800  BP: (!) 103/59 (!) 123/99  Pulse: (!) 52 (!) 50  Resp: 17 16  Temp: 98.1 F (36.7 C)   SpO2: 97% 97%    Intake/Output Summary (Last 24 hours) at 10/08/2019 0851 Last data filed at 10/08/2019 0333 Gross per 24 hour  Intake 677.29 ml  Output --  Net 677.29 ml     Laboratory CBC    Component Value Date/Time   WBC 5.2 10/08/2019 0219   HGB 10.0 (L) 10/08/2019 0219   HCT 30.0 (L) 10/08/2019 0219   PLT 84 (L) 10/08/2019 0219    BMET    Component Value Date/Time   NA 138 10/08/2019 0219   K 4.7 10/08/2019 0219   CL 107 10/08/2019 0219   CO2 22 10/08/2019 0219   GLUCOSE 114 (H) 10/08/2019 0219   BUN 48 (H) 10/08/2019 0219   CREATININE 1.76 (H) 10/08/2019 0219   CALCIUM 8.5 (L) 10/08/2019 0219   GFRNONAA 26 (L) 10/08/2019 0219   GFRAA 30 (L) 10/08/2019 0219    COAG Lab Results  Component Value Date   INR 1.2 10/07/2019   INR 0.9 07/21/2013   No results found for: PTT  Antibiotics Anti-infectives (From admission, onward)   None       V. Leia Alf,  M.D., [facility] [place] Office: [phone] Pager:  [phone]

## 2019-10-08 NOTE — Assessment & Plan Note (Addendum)
--  chronicity unclear; no bleeding reported --Hgb was >12 in 2018 --close outpt followup CBC in 1 week recommended

## 2019-10-08 NOTE — Assessment & Plan Note (Addendum)
--  in remission by report --recommend outpt f/u with new PCP; given new VTE may need outpt follow-up with previous oncologist --looks like saw Lavera Guise, MD oncologist in [place] in past; will refer back

## 2019-10-08 NOTE — Assessment & Plan Note (Signed)
--  treatment as per IVC thrombosis

## 2019-10-08 NOTE — TOC Progression Note (Signed)
Transition of Care [facility]) - Progression Note    Patient Details  Name: Claudia Taylor MRN: [id number] Date of Birth: 04-Aug-1932  Transition of Care [facility]) CM/SW [place], RN Phone Number: 10/08/2019, 5:20 PM  Clinical Narrative:    Consult for medication assistance placed. Patient is not eligible for MATCH  assistance due to Medicare/ has insurance. If patient did not select part D from Medicare,  They will have to add when open enrollment  Comes. Will follow up with patient.    Expected Discharge Plan: [place] Barriers to Discharge: Continued Medical Work up  Expected Discharge Plan and Services Expected Discharge Plan: [place]   Discharge Planning Services: CM Consult   Living arrangements for the past 2 months: Single Family Home                                       Social Determinants of Health (SDOH) Interventions    Readmission Risk Interventions No flowsheet data found.

## 2019-10-08 NOTE — Assessment & Plan Note (Signed)
--  management as per VVS, w/ rec for oral anticoagulation, compression hose and outpt follow-up

## 2019-10-08 NOTE — Assessment & Plan Note (Addendum)
--  suspect acute secondary to massive DVT --only CBC on file from 2018 w/ plts 159 --suspect will improve spontaneously --needs close outpt f/u 1 week to check CBC --no bleeding; benefit of anticoagulation outweighs risk at this point

## 2019-10-08 NOTE — Hospital Course (Signed)
87yow seen by orthopedics outpt for leg swelling; outpt doppler showed massive DVT. Pt admitted by orthopedics and both VVS and TRH consulted for recommendations. VVS recommeded observation w/o thrombectomy and IV heparin.

## 2019-10-08 NOTE — Assessment & Plan Note (Signed)
--  one lab 2018 in care everywhere, creatinine 0.9 at that time --BUN and creating slightly better today at 48/1.76 --prerenal pattern suggests dehydration --given improvement already, may return to normal --needs close outpt follow-up w/ PCP (TOC to establish)

## 2019-10-08 NOTE — Progress Notes (Signed)
Subjective: Patient feeling much better today.  Much less leg pain   Objective: Vital signs in last 24 hours: Temp:  [98.1 F (36.7 C)-98.8 F (37.1 C)] 98.1 F (36.7 C) (09/16 0333) Pulse Rate:  [47-62] 53 (09/16 1300) Resp:  [14-18] 14 (09/16 1300) BP: (90-126)/(56-99) 100/58 (09/16 1300) SpO2:  [90 %-100 %] 100 % (09/16 1300)  Intake/Output from previous day: 09/15 0701 - 09/16 0700 In: 677.3 [I.V.:677.3] Out: -  Intake/Output this shift: No intake/output data recorded.  Recent Labs    10/07/19 1307 10/08/19 0219  HGB 11.8* 10.0*   Recent Labs    10/07/19 1307 10/08/19 0219  WBC 6.6 5.2  RBC 3.77* 3.15*  HCT 36.3 30.0*  PLT PLATELET CLUMPS NOTED ON SMEAR, COUNT APPEARS DECREASED 84*   Recent Labs    10/07/19 1307 10/08/19 0219  NA 140 138  K 4.1 4.7  CL 104 107  CO2 24 22  BUN 49* 48*  CREATININE 1.97* 1.76*  GLUCOSE 116* 114*  CALCIUM 9.2 8.5*   Recent Labs    10/07/19 1307  INR 1.2    Neurologically intact Neurovascular intact Sensation intact distally Intact pulses distally Dorsiflexion/Plantar flexion intact    Assessment Principal Problem:   IVC thrombosis (HCC) Active Problems:   Hx of blood clots   Dementia (HCC)   Renal insufficiency   DNR (do not resuscitate)   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  Will check labs in early am and plan for discharge as long as they are stable.  She has follow up with Dr Trula Slade, vascular surgery and her PCP to check renal function and CBC.   Claudia Taylor J Claudia Taylor 10/08/2019, 5:15 PM

## 2019-10-08 NOTE — Progress Notes (Signed)
[facility] for Heparin Indication: DVT  Allergies  Allergen Reactions   Sulfa Antibiotics Other (See Comments)    Unknown    Patient Measurements: Height: 5\' 9"  (175.3 cm) Weight: 61.8 kg (136 lb 3.9 oz) IBW/kg (Calculated) : 66.2 Heparin Dosing Weight: 61.8 kg  Vital Signs: Temp: 98.1 F (36.7 C) (09/16 0333) Temp Source: Oral (09/16 0333) BP: 122/58 (09/16 1100) Pulse Rate: 47 (09/16 1000)  Labs: Recent Labs    10/07/19 1307 10/08/19 0219 10/08/19 1039  HGB 11.8* 10.0*  --   HCT 36.3 30.0*  --   PLT PLATELET CLUMPS NOTED ON SMEAR, COUNT APPEARS DECREASED 84*  --   APTT 28  --   --   LABPROT 14.3  --   --   INR 1.2  --   --   HEPARINUNFRC <0.10*  --  0.41  CREATININE 1.97* 1.76*  --     Estimated Creatinine Clearance: 22.8 mL/min (A) (by C-G formula based on SCr of 1.76 mg/dL (H)).   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 Granddaughter states that in 2019, patient changes physicians and coumadin was stopped     Assessment: 86 y.o. female, being evaluated for a left leg DVT. Ultrasound was positive for DVT. There was nonocclusive thrombus within the inferior vena cava and then thrombus within the iliofemoral and femoral-popliteal segments.  Pharmacy is consulted to initiate heparin.  Lovenox 60 mg given in the ED.  Heparin level this morning came back therapeutic at 0.41, on 1050 units/hr. Hgb 10, plt 84. No s/sx of bleeding or infusion issues.   Plan to transition to apixaban - risk vs benefits discussed with team (renal fx, age) and will proceed with DVT dosing. Scr 1.76 but improving (CrCl 22 mL/min) today.  Goal of Therapy:  Heparin  level 0.3-0.7 units/ml Monitor platelets by anticoagulation protocol: Yes   Plan:  Discontinue heparin infusion Start apixaban 10 mg BID for 7 days then 5 mg BID  Will educate  Monitor CBC and for s/sx of bleeding   Antonietta Jewel, PharmD, [place] Pharmacist  Phone: [phone] 10/08/2019 11:33 AM  Please check AMION for all [place] phone numbers After 10:00 PM, call [place] [phone]

## 2019-10-08 NOTE — Plan of Care (Signed)
  Problem: Education: Goal: Knowledge of General Education information will improve Description: Including pain rating scale, medication(s)/side effects and non-pharmacologic comfort measures Outcome: Progressing   Problem: Health Behavior/Discharge Planning: Goal: Ability to manage health-related needs will improve Outcome: Progressing   Problem: Clinical Measurements: Goal: Ability to maintain clinical measurements within normal limits will improve Outcome: Progressing   Problem: Clinical Measurements: Goal: Diagnostic test results will improve Outcome: Progressing   Problem: Clinical Measurements: Goal: Cardiovascular complication will be avoided Outcome: Progressing   Problem: Nutrition: Goal: Adequate nutrition will be maintained Outcome: Progressing   Problem: Pain Managment: Goal: General experience of comfort will improve Outcome: Progressing   Problem: Safety: Goal: Ability to remain free from injury will improve Outcome: Progressing

## 2019-10-08 NOTE — Assessment & Plan Note (Signed)
--  stable, continue Aricept and Namenda

## 2019-10-08 NOTE — TOC Initial Note (Signed)
Transition of Care [facility]) - Initial/Assessment Note    Patient Details  Name: Claudia Taylor MRN: [id number] Date of Birth: [birth year]  Transition of Care [facility]) CM/SW Contact:    Verdell Carmine, RN Phone Number: 10/08/2019, 9:27 AM  Clinical Narrative:                 Admitted with DVT after a fall.   No PCP listed referred to [facility] internal medicine for follow up. Eligibility sent for DOAC and lovenox.  CM will follow for needs.    Expected Discharge Plan: [place] Barriers to Discharge: Continued Medical Work up   Patient Goals and CMS Choice        Expected Discharge Plan and Services Expected Discharge Plan: [place]   Discharge Planning Services: CM Consult   Living arrangements for the past 2 months: Single Family Home                                      Prior Living Arrangements/Services Living arrangements for the past 2 months: Single Family Home Lives with:: Adult Children Patient language and need for interpreter reviewed:: Yes        Need for Family Participation in Patient Care: Yes (Comment) Care giver support system in place?: Yes (comment)   Criminal Activity/Legal Involvement Pertinent to Current Situation/Hospitalization: No - Comment as needed  Activities of Daily Living      Permission Sought/Granted                  Emotional Assessment       Orientation: : Fluctuating Orientation (Suspected and/or reported Sundowners) Alcohol / Substance Use: Not Applicable Psych Involvement: No (comment)  Admission diagnosis:  IVC thrombosis ([place]) [I82.220] Patient Active Problem List   Diagnosis Date Noted  . IVC thrombosis ([place]) 10/07/2019  . Renal dysfunction 10/07/2019  . DNR (do not resuscitate) 10/07/2019  . Hx of blood clots   . Dementia [facility])    PCP:  Patient, No Pcp Per Pharmacy:   [facility], [place] [address] Phone:  [phone] Fax: [phone]     Social Determinants of Health (SDOH) Interventions    Readmission Risk Interventions No flowsheet data found.

## 2019-10-09 LAB — BASIC METABOLIC PANEL
Anion gap: 5 (ref 5–15)
BUN: 33 mg/dL — ABNORMAL HIGH (ref 8–23)
CO2: 27 mmol/L (ref 22–32)
Calcium: 8.4 mg/dL — ABNORMAL LOW (ref 8.9–10.3)
Chloride: 107 mmol/L (ref 98–111)
Creatinine, Ser: 1.36 mg/dL — ABNORMAL HIGH (ref 0.44–1.00)
GFR calc Af Amer: 41 mL/min — ABNORMAL LOW (ref >60)
GFR calc non Af Amer: 35 mL/min — ABNORMAL LOW (ref >60)
Glucose, Bld: 102 mg/dL — ABNORMAL HIGH (ref 70–99)
Potassium: 4.6 mmol/L (ref 3.5–5.1)
Sodium: 139 mmol/L (ref 135–145)

## 2019-10-09 LAB — CBC
HCT: 30.8 % — ABNORMAL LOW (ref 36.0–46.0)
Hemoglobin: 10.2 g/dL — ABNORMAL LOW (ref 12.0–15.0)
MCH: 32.1 pg (ref 26.0–34.0)
MCHC: 33.1 g/dL (ref 30.0–36.0)
MCV: 96.9 fL (ref 80.0–100.0)
Platelets: 101 10*3/uL — ABNORMAL LOW (ref 150–400)
RBC: 3.18 MIL/uL — ABNORMAL LOW (ref 3.87–5.11)
RDW: 13.2 % (ref 11.5–15.5)
WBC: 4.2 10*3/uL (ref 4.0–10.5)
nRBC: 0 % (ref 0.0–0.2)

## 2019-10-09 MED ORDER — MELATONIN 5 MG PO TABS
10.0000 mg | ORAL_TABLET | Freq: Every evening | ORAL | Status: DC | PRN
  Administered 2019-10-09: 10 mg via ORAL
  Filled 2019-10-09: qty 2

## 2019-10-09 MED ORDER — APIXABAN (ELIQUIS) VTE STARTER PACK (10MG AND 5MG): ORAL_TABLET | ORAL | 0 refills | Status: DC

## 2019-10-09 MED ORDER — ACETAMINOPHEN 325 MG PO TABS: 650.0000 mg | ORAL_TABLET | Freq: Four times a day (QID) | ORAL | 0 refills | Status: AC | PRN

## 2019-10-09 MED ORDER — HALOPERIDOL LACTATE 5 MG/ML IJ SOLN
2.5000 mg | Freq: Once | INTRAMUSCULAR | Status: AC
  Administered 2019-10-09: 2.5 mg via INTRAVENOUS

## 2019-10-09 MED ORDER — HALOPERIDOL LACTATE 5 MG/ML IJ SOLN
INTRAMUSCULAR | Status: AC
  Filled 2019-10-09: qty 1

## 2019-10-09 NOTE — Progress Notes (Signed)
Brief followup note  Was confused overnight, seems to be doing well now. Vitals stable BUN and creatinine continue to trend down.  Platelets trending up.  Hemoglobin stable.  No changes to recommendations.  Okay for discharge.  No charge note.  Murray Hodgkins, MD [facility]

## 2019-10-09 NOTE — Progress Notes (Signed)
HOSPITAL MEDICINE OVERNIGHT EVENT NOTE    Notified by nursing earlier in the evening that patient was becoming restless and not able to sleep.  Melatonin was given at that time with no effect.  As the evening went on, patient became progressively more confused, yelling at staff and trying to get out of bed.  This is despite family at bedside throughout the evening and frequent attempts at redirection by nursing.    Patient exhibits no focal neurologic deficits per nursing.  ECG obtained reveals a QTc of 447 and reveals sinus bradycardia at 54BPM with PAC's.    Will administer trial of low dose Haldol at 2.5mg  IV x 1 now.   Vernelle Emerald  MD [facility]

## 2019-10-09 NOTE — Discharge Summary (Signed)
Patient ID: Claudia Taylor MRN: [id number] DOB/AGE: 1932-10-10 86 y.o.  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 Arthritis   . Dementia ([place])   . Diffuse small cell non-Hodgkin's lymphoma ([place]) 2010   still has port but has been in remission a few years   . History of total knee replacement, right    Done in [place]  . Hx of blood clots    patient was on coumadin for years. Granddaughter states that in 2019, patient changes physicians and coumadin was stopped     Surgeries:  on * No surgery found *   Consultants: Treatment Team:  Claudia Mitchell, MD  Discharged Condition: Improved  Hospital Course: Claudia Taylor is an 86 y.o. female who was admitted 10/07/2019 for treatment ofIVC thrombosis ([place]). The clot burden was so large that vascular surgery was consulted for possible surgical intervention   On admission patient was found to have significant renal insufficiency due to dehydration   Initially started on heparin, patient had a significant drop in platelets   She was kept a second night to make sure her platelets stabilized, renal function improved and she had not ldeveloped ab deficiencies from Eliquis        Patient benefited maximally from hospital stay and there were no complications.    Recent vital signs:  Patient Vitals for the past 24 hrs:  BP Temp Temp src Pulse Resp SpO2  10/09/19 0300 (!) 144/83 97.9 F (36.6 C) Axillary (!) 57 17 95 %  10/08/19 2300 126/72 97.9 F (36.6 C) Oral (!) 48 14 94 %  10/08/19 1930 132/65 98.4 F (36.9  C) Oral (!) 50 18 100 %  10/08/19 1300 (!) 100/58 -- -- (!) 53 14 100 %  10/08/19 1126 -- -- -- -- 17 97 %  10/08/19 1100 (!) 122/58 -- -- -- 16 --  10/08/19 1000 113/63 -- -- (!) 47 15 100 %  10/08/19 0900 (!) 104/56 -- -- (!) 55 16 100 %  10/08/19 0800 (!) 123/99 -- -- (!) 50 16 97 %     Recent laboratory studies:  Recent Labs    10/07/19 1307 10/07/19 1307 10/08/19 0219 10/08/19 0219 10/09/19 0016  WBC 6.6   < > 5.2  --  4.2  HGB 11.8*   < > 10.0*  --  10.2*  HCT 36.3   < > 30.0*  --  30.8*  PLT PLATELET CLUMPS NOTED ON SMEAR, COUNT APPEARS DECREASED   < > 84*  --  101*  NA 140   < > 138  --  139  K 4.1   < > 4.7  --  4.6  CL 104   < > 107  --  107  CO2 24   < > 22  --  27  BUN 49*   < > 48*  --  33*  CREATININE 1.97*   < > 1.76*  --  1.36*  GLUCOSE 116*   < > 114*  --  102*  INR 1.2  --   --   --   --  CALCIUM 9.2   < > 8.5*   < > 8.4*   < > = values in this interval not displayed.     Discharge Medications:   Allergies as of 10/09/2019      Reactions   Sulfa Antibiotics Other (See Comments)   Unknown      Medication List    STOP taking these medications   lisinopril 20 MG tablet Commonly known as: ZESTRIL     TAKE these medications   acetaminophen 325 MG tablet Commonly known as: TYLENOL Take 2 tablets (650 mg total) by mouth every 6 (six) hours as needed for mild pain (or Fever >/= 101).   Apixaban Starter Pack (10mg  and 5mg ) Commonly known as: ELIQUIS STARTER PACK Take as directed on package: start with two-5mg  tablets twice daily for 7 days. On day 8, switch to one-5mg  tablet twice daily.   bisacodyl 5 MG EC tablet Commonly known as: DULCOLAX Take 5 mg by mouth daily as needed for moderate constipation.   donepezil 10 MG tablet Commonly known as: ARICEPT Take 10 mg by mouth daily.   ipratropium 0.06 % nasal spray Commonly known as: ATROVENT Place 2 sprays into both nostrils 3 (three) times daily.   memantine 21 MG Cp24 24 hr  capsule Commonly known as: NAMENDA XR Take 21 mg by mouth at bedtime.   VITAMIN B-12 IJ Inject as directed every 30 (thirty) days.       Diagnostic Studies: VAS [place] IVC/ILIAC (VENOUS ONLY)  Result Date: 10/07/2019 IVC/ILIAC STUDY Indications: DVT in left lower extremity  Comparison Study: No prior study Performing Technologist: Maudry Mayhew MHA, RDMS, RVT, RDCS  Examination Guidelines: A complete evaluation includes B-mode imaging, spectral Doppler, color Doppler, and power Doppler as needed of all accessible portions of each vessel. Bilateral testing is considered an integral part of a complete examination. Limited examinations for reoccurring indications may be performed as noted.  IVC/Iliac Findings: +----------+------+--------+--------+    IVC    PatentThrombusComments +----------+------+--------+--------+ IVC Prox         acute           +----------+------+--------+--------+ IVC Mid          acute           +----------+------+--------+--------+ IVC Distal       acute           +----------+------+--------+--------+  +-------------------+---------+-----------+---------+-----------+--------+         CIV        RT-PatentRT-ThrombusLT-PatentLT-ThrombusComments +-------------------+---------+-----------+---------+-----------+--------+ Common Iliac Prox   patent                         acute            +-------------------+---------+-----------+---------+-----------+--------+ Common Iliac Mid    patent                         acute            +-------------------+---------+-----------+---------+-----------+--------+ Common Iliac Distal patent                         acute            +-------------------+---------+-----------+---------+-----------+--------+  +-------------------------+---------+-----------+---------+-----------+--------+            EIV           RT-PatentRT-ThrombusLT-PatentLT-ThrombusComments  +-------------------------+---------+-----------+---------+-----------+--------+ External Iliac Vein       patent  acute            Distal                                                                    +-------------------------+---------+-----------+---------+-----------+--------+   Summary: IVC/Iliac: There is evidence of acute thrombus involving the IVC, throughout its entire length. There is no evidence of thrombus involving the right common iliac vein. There is evidence of acute thrombus involving the left common iliac vein. There is no evidence of thrombus involving the right external iliac vein. There is evidence of acute thrombus involving the left external iliac vein.  *See table(s) above for measurements and observations.  Electronically signed by Harold Barban MD on 10/07/2019 at 5:21:30 PM.    Final    ECHOCARDIOGRAM COMPLETE  Result Date: 10/07/2019    ECHOCARDIOGRAM REPORT   Patient Name:   JAILYNE CHIEFFO [facility] Date of Exam: 10/07/2019 Medical Rec #:  [id number]                 Height:       68.9 in Accession #:    [id number]                Weight:       136.2 lb Date of Birth:  [birth year]                 BSA:          1.753 m Patient Age:    62 years                  BP:           00/00 mmHg Patient Gender: F                         HR:           64 bpm. Exam Location:  Outpatient Procedure: 2D Echo, Cardiac Doppler and Color Doppler Indications:    DVT  History:        Patient has no prior history of Echocardiogram examinations.  Sonographer:    Dustin Flock Referring Phys: [id number] Matthew Saras IMPRESSIONS  1. Left ventricular ejection fraction, by estimation, is 60 to 65%. The left ventricle has normal function. The left ventricle has no regional wall motion abnormalities. Left ventricular diastolic parameters are consistent with Grade I diastolic dysfunction (impaired relaxation).  2. Right ventricular systolic function is normal. The right  ventricular size is not well visualized.  3. The mitral valve is normal in structure. Trivial mitral valve regurgitation. No evidence of mitral stenosis.  4. The aortic valve is calcified. There is mild calcification of the aortic valve. Aortic valve regurgitation is trivial. No aortic stenosis is present. FINDINGS  Left Ventricle: Left ventricular ejection fraction, by estimation, is 60 to 65%. The left ventricle has normal function. The left ventricle has no regional wall motion abnormalities. The left ventricular internal cavity size was normal in size. There is  no left ventricular hypertrophy. Left ventricular diastolic parameters are consistent with Grade I diastolic dysfunction (impaired relaxation). Right Ventricle: The right ventricular size is not well visualized. Right vetricular wall thickness was not well visualized. Right ventricular systolic function is normal. Left Atrium:  Left atrial size was normal in size. Right Atrium: Right atrial size was normal in size. Pericardium: There is no evidence of pericardial effusion. Mitral Valve: The mitral valve is normal in structure. Trivial mitral valve regurgitation. No evidence of mitral valve stenosis. Tricuspid Valve: The tricuspid valve is normal in structure. Tricuspid valve regurgitation is mild . No evidence of tricuspid stenosis. Aortic Valve: The aortic valve is calcified. There is mild calcification of the aortic valve. Aortic valve regurgitation is trivial. No aortic stenosis is present. Pulmonic Valve: The pulmonic valve was normal in structure. Pulmonic valve regurgitation is trivial. No evidence of pulmonic stenosis. Aorta: The aortic root is normal in size and structure. Venous: The inferior vena cava was not well visualized. IAS/Shunts: No atrial level shunt detected by color flow Doppler.  LEFT VENTRICLE PLAX 2D LVIDd:         3.90 cm  Diastology LVIDs:         2.80 cm  LV e' medial:    5.87 cm/s LV PW:         1.00 cm  LV E/e' medial:  9.9 LV  IVS:        0.90 cm  LV e' lateral:   9.36 cm/s LVOT diam:     1.70 cm  LV E/e' lateral: 6.2 LV SV:         49 LV SV Index:   28 LVOT Area:     2.27 cm  RIGHT VENTRICLE RV Basal diam:  2.70 cm RV S prime:     10.00 cm/s TAPSE (M-mode): 3.0 cm LEFT ATRIUM             Index       RIGHT ATRIUM           Index LA diam:        2.80 cm 1.60 cm/m  RA Area:     12.40 cm LA Vol (A2C):   41.1 ml 23.45 ml/m RA Volume:   25.00 ml  14.26 ml/m LA Vol (A4C):   34.8 ml 19.85 ml/m LA Biplane Vol: 39.2 ml 22.36 ml/m  AORTIC VALVE LVOT Vmax:   112.00 cm/s LVOT Vmean:  63.800 cm/s LVOT VTI:    0.217 m  AORTA Ao Root diam: 2.50 cm MITRAL VALVE               TRICUSPID VALVE MV Area (PHT): 2.42 cm    TR Peak grad:   26.6 mmHg MV Decel Time: 313 msec    TR Vmax:        258.00 cm/s MV E velocity: 58.30 cm/s MV A velocity: 69.00 cm/s  SHUNTS MV E/A ratio:  0.84        Systemic VTI:  0.22 m                            Systemic Diam: 1.70 cm Cherlynn Kaiser MD Electronically signed by Cherlynn Kaiser MD Signature Date/Time: 10/07/2019/9:22:25 PM    Final    VAS [place] LOWER EXTREMITY VENOUS (DVT)  Result Date: 10/07/2019  Lower Venous DVTStudy Indications: S/p fall 10/05/2019, right lower extremity swelling and knee pain.  Comparison Study: No prior study. Performing Technologist: Maudry Mayhew MHA, RDMS, RVT, RDCS  Examination Guidelines: A complete evaluation includes B-mode imaging, spectral Doppler, color Doppler, and power Doppler as needed of all accessible portions of each vessel. Bilateral testing is considered an integral part of a complete examination. Limited examinations for  reoccurring indications may be performed as noted. The reflux portion of the exam is performed with the patient in reverse Trendelenburg.  +-----+---------------+---------+-----------+----------+--------------+ RIGHTCompressibilityPhasicitySpontaneityPropertiesThrombus Aging  +-----+---------------+---------+-----------+----------+--------------+ CFV  Full           Yes      Yes                                 +-----+---------------+---------+-----------+----------+--------------+   +---------+---------------+---------+-----------+-----------------+------------+ LEFT     CompressibilityPhasicitySpontaneityProperties       Thrombus                                                                  Aging        +---------+---------------+---------+-----------+-----------------+------------+ CFV      None                    No                          Acute        +---------+---------------+---------+-----------+-----------------+------------+ SFJ      None                                                Acute        +---------+---------------+---------+-----------+-----------------+------------+ FV Prox  None                                                Acute        +---------+---------------+---------+-----------+-----------------+------------+ FV Mid   None                                                Acute        +---------+---------------+---------+-----------+-----------------+------------+ FV DistalNone                                                Acute        +---------+---------------+---------+-----------+-----------------+------------+ PFV      None                                                Acute        +---------+---------------+---------+-----------+-----------------+------------+ POP      None           No       Yes        partially        Acute  re-cannalized                 +---------+---------------+---------+-----------+-----------------+------------+ PTV      None                    No                          Acute        +---------+---------------+---------+-----------+-----------------+------------+ PERO     None                     No                          Acute        +---------+---------------+---------+-----------+-----------------+------------+ Gastroc  None                    No                          Acute        +---------+---------------+---------+-----------+-----------------+------------+     Summary: RIGHT: - No evidence of common femoral vein obstruction.  LEFT: - Findings consistent with acute deep vein thrombosis involving the left common femoral vein, SF junction, left femoral vein, left proximal profunda vein, left popliteal vein, left posterior tibial veins, left peroneal veins, and left gastrocnemius veins. - No cystic structure found in the popliteal fossa.  *See table(s) above for measurements and observations. Electronically signed by Harold Barban MD on 10/07/2019 at 5:21:24 PM.    Final     Disposition: Discharge disposition: 01-Home or Self Care       Discharge Instructions    Diet - low sodium heart healthy   Complete by: As directed    Discharge instructions   Complete by: As directed    Discharge to home   follow up with [facility] on 9/23 to recheck labs.   Follow up with Dr Trula Slade on 9/27   Increase activity slowly   Complete by: As directed    Increase activity slowly   Complete by: As directed        Follow-up Information    Ernestene Kiel, MD Follow up on 10/15/2019.   Specialty: Internal Medicine Why: appointment with Wynonia Sours, NP at 12 noon on 10/15/19  Contact information: [address]. [place] [address] [phone]        Claudia Mitchell, MD Follow up on 10/19/2019.   Specialties: Vascular Surgery, Cardiology Why: appointment with Dr Trula Slade at  Sept 27th at 9: 40 am Contact information: [address] [place] [address] [phone]        Marice Potter, MD. Schedule an appointment as soon as possible for a visit in 2 week(s).   Specialty: Oncology Why: call Dr. Bobby Rumpf to set up an appointment in 1-2 weeks Contact information: [address]. [place] [address] [phone]                Signed: Joaquin Courts Kavonte Bearse 10/09/2019, 5:27 AM

## 2019-10-09 NOTE — TOC Transition Note (Signed)
Transition of Care [facility]) - CM/SW Discharge Note   Patient Details  Name: Claudia Taylor MRN: [id number] Date of Birth: 08-07-1932  Transition of [facility] CM/SW Contact:  Verdell Carmine, RN Phone Number: 10/09/2019, 11:32 AM   Clinical Narrative:    Spoke to daughter to see if she had any needs for [facility]. They are looking into hiring care givers, no needs for [facility] or DME  equipment at this time   Final next level of care: Home/Self Care Barriers to Discharge: Continued Medical Work up   Patient Goals and CMS Choice        Discharge Placement                       Discharge Plan and Services   Discharge Planning Services: CM Consult                                 Social Determinants of Health (SDOH) Interventions     Readmission Risk Interventions No flowsheet data found.

## 2019-10-09 NOTE — Progress Notes (Signed)
Patient attempting to climb out of bed once again, restless and agitated. Nursing staff attempted to re-direct patient, but patient become violent and attempted to swing and slap staff. MD Shalhoub paged. Non-violent restraints protocol started. Posey soft waist belt applied per protocol. Patient's granddaughter present at bedside. Will continue to monitor.

## 2019-10-09 NOTE — Progress Notes (Signed)
Patient became increasingly agitated as night progressed. This RN notified by family that patient was attempting to pull at IV and lines. Mittens applied with no success, patient just became more agitated. Patient then attempting to climb out of bed. Nursing staff and patient's granddaughter attempted to re-direct her with no success. Patient started yelling at staff and stating she wanted to leave. MD was paged with low-dose Haldol given. Patient now resting comfortably in bed with granddaughter at bedside. VSS, will continue to monitor for any adverse effects.

## 2019-10-15 ENCOUNTER — Encounter: Payer: Medicare Other | Admitting: Internal Medicine

## 2019-10-15 DIAGNOSIS — G309 Alzheimer's disease, unspecified: Secondary | ICD-10-CM | POA: Diagnosis not present

## 2019-10-15 DIAGNOSIS — I82422 Acute embolism and thrombosis of left iliac vein: Secondary | ICD-10-CM | POA: Diagnosis not present

## 2019-10-15 DIAGNOSIS — Z09 Encounter for follow-up examination after completed treatment for conditions other than malignant neoplasm: Secondary | ICD-10-CM | POA: Diagnosis not present

## 2019-10-15 DIAGNOSIS — F0391 Unspecified dementia with behavioral disturbance: Secondary | ICD-10-CM | POA: Diagnosis not present

## 2019-10-19 ENCOUNTER — Other Ambulatory Visit: Payer: Self-pay

## 2019-10-19 ENCOUNTER — Encounter: Payer: Self-pay | Admitting: Surgery

## 2019-10-19 ENCOUNTER — Ambulatory Visit (INDEPENDENT_AMBULATORY_CARE_PROVIDER_SITE_OTHER): Payer: Medicare Other | Admitting: Surgery

## 2019-10-19 VITALS — BP 150/102 | HR 62 | Temp 98.3°F | Resp 18 | Ht 69.0 in | Wt 134.0 lb

## 2019-10-19 DIAGNOSIS — I82492 Acute embolism and thrombosis of other specified deep vein of left lower extremity: Secondary | ICD-10-CM | POA: Diagnosis not present

## 2019-10-19 NOTE — Progress Notes (Signed)
[facility]  Patient name: Claudia Taylor MRN: [id number] DOB: Apr 16, 1932 Sex: female   REASON FOR VISIT:    Follow up  HISOTRY OF PRESENT ILLNESS:    Claudia Taylor is a 86 y.o. female who was admitted to the hospital for extensive left leg DVT.  This occurred following a fall.  She had nonocclusive thrombus within the inferior vena cava extending distally.  She was started on anticoagulation.  She did not really have any significant leg pain, just swelling.  In addition, she suffers from mild dementia.  Therefore I felt that anticoagulation and compression would be her best course of treatment.  She is back today for follow-up evaluation.  She has been able to wear her compression stockings.  She has no pain in her leg.  The patient has had a blood clot in the past. She was on Coumadin up until 2019 when it was discontinued by her primary care physician. The patient also has a history of small cell non-Hodgkin's lymphoma. She is a never smoker.  PAST MEDICAL HISTORY:   Past Medical History:  Diagnosis Date   Arthritis    Dementia ([place])    Diffuse small cell non-Hodgkin's lymphoma ([place]) 2010   still has port but has been in remission a few years    History of total knee replacement, right    Done in [place]   Hx of blood clots    patient was on coumadin for years. Granddaughter states that in 2019, patient changes physicians and coumadin was stopped      FAMILY HISTORY:   Family History  Problem Relation Age of Onset   COPD Mother     SOCIAL HISTORY:   Social History   Tobacco Use   Smoking status: Never Smoker   Smokeless tobacco: Never Used  Substance Use Topics   Alcohol use: Never     ALLERGIES:   Allergies  Allergen Reactions   Sulfa Antibiotics Other (See Comments)    Unknown     CURRENT MEDICATIONS:   Current Outpatient Medications  Medication  Sig Dispense Refill   acetaminophen (TYLENOL) 325 MG tablet Take 2 tablets (650 mg total) by mouth every 6 (six) hours as needed for mild pain (or Fever >/= 101). 30 tablet 0   bisacodyl (DULCOLAX) 5 MG EC tablet Take 5 mg by mouth daily as needed for moderate constipation.     Cyanocobalamin (VITAMIN B-12 IJ) Inject as directed every 30 (thirty) days.      donepezil (ARICEPT) 10 MG tablet Take 10 mg by mouth daily.     ELIQUIS 5 MG TABS tablet Take by mouth.     ipratropium (ATROVENT) 0.06 % nasal spray Place 2 sprays into both nostrils 3 (three) times daily.      lisinopril (ZESTRIL) 10 MG tablet Take 10 mg by mouth daily.     LORazepam (ATIVAN) 0.5 MG tablet Take 0.5 mg by mouth daily as needed.     memantine (NAMENDA XR) 21 MG CP24 24 hr capsule Take 21 mg by mouth at bedtime.      No current facility-administered medications for this visit.    REVIEW OF SYSTEMS:   [X]  denotes positive finding, [ ]  denotes negative finding Cardiac  Comments:  Chest pain or chest pressure:    Shortness of breath upon exertion:    Short of breath when lying flat:    Irregular heart rhythm:  Vascular    Pain in calf, thigh, or hip brought on by ambulation:    Pain in feet at night that wakes you up from your sleep:     Blood clot in your veins:    Leg swelling:  x       Pulmonary    Oxygen at home:    Productive cough:     Wheezing:         Neurologic    Sudden weakness in arms or legs:     Sudden numbness in arms or legs:     Sudden onset of difficulty speaking or slurred speech:    Temporary loss of vision in one eye:     Problems with dizziness:         Gastrointestinal    Blood in stool:     Vomited blood:         Genitourinary    Burning when urinating:     Blood in urine:        Psychiatric    Major depression:         Hematologic    Bleeding problems:    Problems with blood clotting too easily:        Skin    Rashes or ulcers:        Constitutional      Fever or chills:      PHYSICAL EXAM:   Vitals:   10/19/19 0957  BP: (!) 150/102  Pulse: 62  Resp: 18  Temp: 98.3 F (36.8 C)  SpO2: 97%  Weight: 134 lb (60.8 kg)  Height: 5\' 9"  (1.753 m)    GENERAL: The patient is a well-nourished female, in no acute distress. The vital signs are documented above. CARDIAC: There is a regular rate and rhythm.  VASCULAR: Significantly improved left leg edema PULMONARY: Non-labored respirations MUSCULOSKELETAL: There are no major deformities or cyanosis. NEUROLOGIC: No focal weakness or paresthesias are detected. SKIN: There are no ulcers or rashes noted. PSYCHIATRIC: The patient has a normal affect.  STUDIES:   None today  MEDICAL ISSUES:   Left leg DVT: The patient has had an excellent response to anticoagulation and compression socks.  She has minimal edema on the left leg.  I do not think that intervention is warranted at this time.  Patient has a history of DVT.  I would continue her with full dose anticoagulation for 6 months and then she will likely need maintenance anticoagulation given her prior history.  She will follow-up with me on a as needed basis.    Leia Alf, MD, FACS [facility] [phone] Pager [phone]

## 2019-10-30 DIAGNOSIS — C8102 Nodular lymphocyte predominant Hodgkin lymphoma, intrathoracic lymph nodes: Secondary | ICD-10-CM | POA: Diagnosis not present

## 2019-12-11 DIAGNOSIS — F0390 Unspecified dementia without behavioral disturbance: Secondary | ICD-10-CM | POA: Diagnosis not present

## 2019-12-11 DIAGNOSIS — F0280 Dementia in other diseases classified elsewhere without behavioral disturbance: Secondary | ICD-10-CM | POA: Diagnosis not present

## 2019-12-11 DIAGNOSIS — G301 Alzheimer's disease with late onset: Secondary | ICD-10-CM | POA: Diagnosis not present

## 2019-12-29 DIAGNOSIS — B351 Tinea unguium: Secondary | ICD-10-CM | POA: Diagnosis not present

## 2019-12-29 DIAGNOSIS — M79675 Pain in left toe(s): Secondary | ICD-10-CM | POA: Diagnosis not present

## 2019-12-29 DIAGNOSIS — M79674 Pain in right toe(s): Secondary | ICD-10-CM | POA: Diagnosis not present

## 2020-01-02 DIAGNOSIS — G319 Degenerative disease of nervous system, unspecified: Secondary | ICD-10-CM | POA: Diagnosis not present

## 2020-01-02 DIAGNOSIS — I771 Stricture of artery: Secondary | ICD-10-CM | POA: Diagnosis not present

## 2020-01-02 DIAGNOSIS — Z8572 Personal history of non-Hodgkin lymphomas: Secondary | ICD-10-CM | POA: Diagnosis not present

## 2020-01-02 DIAGNOSIS — G301 Alzheimer's disease with late onset: Secondary | ICD-10-CM | POA: Diagnosis not present

## 2020-01-02 DIAGNOSIS — F0390 Unspecified dementia without behavioral disturbance: Secondary | ICD-10-CM | POA: Diagnosis not present

## 2020-01-02 DIAGNOSIS — F0280 Dementia in other diseases classified elsewhere without behavioral disturbance: Secondary | ICD-10-CM | POA: Diagnosis not present

## 2020-01-13 DIAGNOSIS — I10 Essential (primary) hypertension: Secondary | ICD-10-CM | POA: Diagnosis not present

## 2020-01-13 DIAGNOSIS — Z23 Encounter for immunization: Secondary | ICD-10-CM | POA: Diagnosis not present

## 2020-01-21 DIAGNOSIS — I82402 Acute embolism and thrombosis of unspecified deep veins of left lower extremity: Secondary | ICD-10-CM | POA: Diagnosis not present

## 2020-01-21 DIAGNOSIS — G301 Alzheimer's disease with late onset: Secondary | ICD-10-CM | POA: Diagnosis not present

## 2020-01-21 DIAGNOSIS — F0280 Dementia in other diseases classified elsewhere without behavioral disturbance: Secondary | ICD-10-CM | POA: Diagnosis not present

## 2020-02-09 DIAGNOSIS — I82432 Acute embolism and thrombosis of left popliteal vein: Secondary | ICD-10-CM | POA: Diagnosis not present

## 2020-02-09 DIAGNOSIS — M7989 Other specified soft tissue disorders: Secondary | ICD-10-CM | POA: Diagnosis not present

## 2020-02-09 DIAGNOSIS — R600 Localized edema: Secondary | ICD-10-CM | POA: Diagnosis not present

## 2020-02-09 DIAGNOSIS — E7800 Pure hypercholesterolemia, unspecified: Secondary | ICD-10-CM | POA: Diagnosis not present

## 2020-02-09 DIAGNOSIS — R0602 Shortness of breath: Secondary | ICD-10-CM | POA: Diagnosis not present

## 2020-02-09 DIAGNOSIS — I10 Essential (primary) hypertension: Secondary | ICD-10-CM | POA: Diagnosis not present

## 2020-02-09 DIAGNOSIS — K219 Gastro-esophageal reflux disease without esophagitis: Secondary | ICD-10-CM | POA: Diagnosis not present

## 2020-02-09 DIAGNOSIS — R609 Edema, unspecified: Secondary | ICD-10-CM | POA: Diagnosis not present

## 2020-03-28 DIAGNOSIS — M1712 Unilateral primary osteoarthritis, left knee: Secondary | ICD-10-CM | POA: Diagnosis not present

## 2020-03-28 DIAGNOSIS — I10 Essential (primary) hypertension: Secondary | ICD-10-CM | POA: Diagnosis not present

## 2020-03-28 DIAGNOSIS — E538 Deficiency of other specified B group vitamins: Secondary | ICD-10-CM | POA: Diagnosis not present

## 2020-03-28 DIAGNOSIS — Z6824 Body mass index (BMI) 24.0-24.9, adult: Secondary | ICD-10-CM | POA: Diagnosis not present

## 2020-03-28 DIAGNOSIS — G309 Alzheimer's disease, unspecified: Secondary | ICD-10-CM | POA: Diagnosis not present

## 2020-03-28 DIAGNOSIS — Z86711 Personal history of pulmonary embolism: Secondary | ICD-10-CM | POA: Diagnosis not present

## 2020-03-28 DIAGNOSIS — Z79899 Other long term (current) drug therapy: Secondary | ICD-10-CM | POA: Diagnosis not present

## 2020-04-04 DIAGNOSIS — M1712 Unilateral primary osteoarthritis, left knee: Secondary | ICD-10-CM | POA: Diagnosis not present

## 2020-04-20 DIAGNOSIS — I82402 Acute embolism and thrombosis of unspecified deep veins of left lower extremity: Secondary | ICD-10-CM | POA: Diagnosis not present

## 2020-04-20 DIAGNOSIS — G301 Alzheimer's disease with late onset: Secondary | ICD-10-CM | POA: Diagnosis not present

## 2020-04-20 DIAGNOSIS — F0280 Dementia in other diseases classified elsewhere without behavioral disturbance: Secondary | ICD-10-CM | POA: Diagnosis not present

## 2020-05-05 ENCOUNTER — Other Ambulatory Visit: Payer: Self-pay | Admitting: Oncology

## 2020-05-05 DIAGNOSIS — C8292 Follicular lymphoma, unspecified, intrathoracic lymph nodes: Secondary | ICD-10-CM

## 2020-05-05 NOTE — Progress Notes (Deleted)
  [facility]  [address] [place],  [place]  [address] [phone]  Clinic Day:  05/05/2020  Referring physician: Ernestene Kiel, MD   HISTORY OF PRESENT ILLNESS:  The patient is a 87 y.o. 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  The patient underwent 6 cycles of bendamustine/Rituxan, followed by 2 years of maintenance Rituxan.  All of this therapy was completed in May 2015.  Since then, follow-up labs, exams, and scans have shown no evidence of disease recurrence.  She also developed an extensive DVT involving her IVC and the veins in her left leg.  This occurred after she accidentally fell on her left leg.  The recommendation was for her to be on Eliquis, but not for longer than 3-6 months due to her having dementia and an increased propensity to fall.  She comes in today for routine follow-up.   PHYSICAL EXAM:  There were no vitals taken for this visit. Wt Readings from Last 3 Encounters:  10/19/19 134 lb (60.8 kg)  10/07/19 136 lb 3.9 oz (61.8 kg)  10/07/19 135 lb (61.2 kg)   There is no height or weight on file to calculate BMI. Performance status (ECOG): {CHL ONC [id number] Physical Exam  LABS:   CBC Latest Ref Rng & Units 10/09/2019 10/08/2019 10/07/2019  WBC 4.0 - 10.5 K/uL 4.2 5.2 6.6  Hemoglobin 12.0 - 15.0 g/dL 10.2(L) 10.0(L) 11.8(L)  Hematocrit 36.0 - 46.0 % 30.8(L) 30.0(L) 36.3  Platelets 150 - 400 K/uL 101(L) 84(L) PLATELET CLUMPS NOTED ON SMEAR, COUNT APPEARS DECREASED   CMP Latest Ref Rng & Units 10/09/2019 10/08/2019 10/07/2019  Glucose 70 - 99 mg/dL 102(H) 114(H) 116(H)  BUN 8 - 23 mg/dL 33(H) 48(H) 49(H)  Creatinine 0.44 - 1.00 mg/dL 1.36(H) 1.76(H) 1.97(H)  Sodium 135 - 145 mmol/L 139 138 140  Potassium 3.5 - 5.1 mmol/L 4.6 4.7 4.1  Chloride 98 - 111 mmol/L 107 107 104  CO2 22 - 32 mmol/L 27 22 24   Calcium 8.9 - 10.3 mg/dL 8.4(L) 8.5(L) 9.2  Total  Protein 6.5 - 8.1 g/dL - - 7.3  Total Bilirubin 0.3 - 1.2 mg/dL - - 0.8  Alkaline Phos 38 - 126 U/L - - 67  AST 15 - 41 U/L - - 17  ALT 0 - 44 U/L - - 10     No results found for: CEA1 / No results found for: CEA1 No results found for: PSA1 No results found for: PNP005 No results found for: RTM211  No results found for: TOTALPROTELP, ALBUMINELP, A1GS, A2GS, BETS, BETA2SER, GAMS, MSPIKE, SPEI No results found for: TIBC, FERRITIN, IRONPCTSAT No results found for: LDH  No results found for: AFPTUMOR, TOTALPROTELP, ALBUMINELP, A1GS, A2GS, BETS, BETA2SER, GAMS, MSPIKE, SPEI, LDH, CEA1, PSA1, IGASERUM, IGGSERUM, IGMSERUM, THGAB, THYROGLB  Recent Review Flowsheet Data   There is no flowsheet data to display.      STUDIES:  No results found.    ASSESSMENT & PLAN:   Assessment/Plan:  A 87 y.o. female with *** .The patient understands all the plans discussed today and is in agreement with them.      Elianis Fischbach Macarthur Critchley, MD

## 2020-05-06 ENCOUNTER — Inpatient Hospital Stay: Payer: Medicare Other | Admitting: Oncology

## 2020-05-06 ENCOUNTER — Inpatient Hospital Stay: Payer: Medicare Other

## 2020-05-10 DIAGNOSIS — M25531 Pain in right wrist: Secondary | ICD-10-CM | POA: Diagnosis not present

## 2020-05-18 ENCOUNTER — Inpatient Hospital Stay (INDEPENDENT_AMBULATORY_CARE_PROVIDER_SITE_OTHER): Payer: Medicare Other | Admitting: Oncology

## 2020-05-18 ENCOUNTER — Other Ambulatory Visit: Payer: Self-pay | Admitting: Hematology and Oncology

## 2020-05-18 ENCOUNTER — Other Ambulatory Visit: Payer: Self-pay | Admitting: Oncology

## 2020-05-18 ENCOUNTER — Inpatient Hospital Stay: Payer: Medicare Other | Attending: Oncology

## 2020-05-18 ENCOUNTER — Other Ambulatory Visit: Payer: Self-pay

## 2020-05-18 VITALS — BP 203/95 | HR 72 | Temp 98.5°F | Resp 14 | Ht 69.0 in | Wt 145.1 lb

## 2020-05-18 DIAGNOSIS — C8590 Non-Hodgkin lymphoma, unspecified, unspecified site: Secondary | ICD-10-CM | POA: Diagnosis not present

## 2020-05-18 DIAGNOSIS — R918 Other nonspecific abnormal finding of lung field: Secondary | ICD-10-CM | POA: Diagnosis not present

## 2020-05-18 DIAGNOSIS — C8292 Follicular lymphoma, unspecified, intrathoracic lymph nodes: Secondary | ICD-10-CM

## 2020-05-18 DIAGNOSIS — Z9225 Personal history of immunosupression therapy: Secondary | ICD-10-CM | POA: Diagnosis not present

## 2020-05-18 DIAGNOSIS — Z7901 Long term (current) use of anticoagulants: Secondary | ICD-10-CM | POA: Insufficient documentation

## 2020-05-18 DIAGNOSIS — C8102 Nodular lymphocyte predominant Hodgkin lymphoma, intrathoracic lymph nodes: Secondary | ICD-10-CM | POA: Diagnosis not present

## 2020-05-18 DIAGNOSIS — D649 Anemia, unspecified: Secondary | ICD-10-CM | POA: Diagnosis not present

## 2020-05-18 DIAGNOSIS — I517 Cardiomegaly: Secondary | ICD-10-CM | POA: Diagnosis not present

## 2020-05-18 DIAGNOSIS — F0390 Unspecified dementia without behavioral disturbance: Secondary | ICD-10-CM | POA: Insufficient documentation

## 2020-05-18 LAB — HEPATIC FUNCTION PANEL
ALT: 7 (ref 7–35)
AST: 22 (ref 13–35)
Alkaline Phosphatase: 59 (ref 25–125)
Bilirubin, Total: 0.6

## 2020-05-18 LAB — CBC AND DIFFERENTIAL
HCT: 40 (ref 36–46)
Hemoglobin: 12.8 (ref 12.0–16.0)
Neutrophils Absolute: 1.86
Platelets: 175 (ref 150–399)
WBC: 2.9

## 2020-05-18 LAB — COMPREHENSIVE METABOLIC PANEL
Albumin: 3.9 (ref 3.5–5.0)
Calcium: 8.6 — AB (ref 8.7–10.7)

## 2020-05-18 LAB — BASIC METABOLIC PANEL
BUN: 21 (ref 4–21)
CO2: 26 — AB (ref 13–22)
Chloride: 108 (ref 99–108)
Creatinine: 1 (ref 0.5–1.1)
Glucose: 82
Potassium: 4 (ref 3.4–5.3)
Sodium: 139 (ref 137–147)

## 2020-05-18 LAB — LACTATE DEHYDROGENASE: LDH: 207 U/L — ABNORMAL HIGH (ref 98–192)

## 2020-05-18 LAB — CBC: RBC: 4.39 (ref 3.87–5.11)

## 2020-05-18 NOTE — Progress Notes (Signed)
[facility]  [address] [place],  [place]  [address] [phone]  Clinic Day:  05/18/2020  Referring physician: Ernestene Kiel, MD   HISTORY OF PRESENT ILLNESS:  The patient is a 87 y.o. 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  The patient underwent 6 cycles of bendamustine/Rituxan, followed by 2 years of maintenance Rituxan.  All of this therapy was completed in May 2015.  Since then, follow-up labs, exams, and scans have shown no evidence of disease recurrence.  She also developed an extensive DVT involving her IVC and the veins in her left leg.  This occurred after she accidentally fell on her left leg.  The recommendation was for her to be on Eliquis, but not for longer than 3-6 months.  After completing this, the patient stopped taking Eliquis.  However, a few weeks later, another DVT developed behind her left leg, for which she was placed back on Eliquis indefinitely.  Overall, she has been doing okay.  Unfortunately, her dementia continues to worsen.   PHYSICAL EXAM:  Blood pressure (!) 203/95, pulse 72, temperature 98.5 F (36.9 C), resp. rate 14, height 5\' 9"  (1.753 m), weight 145 lb 1.6 oz (65.8 kg), SpO2 97 %. Wt Readings from Last 3 Encounters:  05/18/20 145 lb 1.6 oz (65.8 kg)  10/19/19 134 lb (60.8 kg)  10/07/19 136 lb 3.9 oz (61.8 kg)   Body mass index is 21.43 kg/m. Performance status (ECOG): 3 - Symptomatic, >50% confined to bed Physical Exam Constitutional:      Appearance: She is not ill-appearing.     Comments: She has dementia and is in a wheelchair  HENT:     Mouth/Throat:     Mouth: Mucous membranes are moist.     Pharynx: Oropharynx is clear. No oropharyngeal exudate or posterior oropharyngeal erythema.  Cardiovascular:     Rate and Rhythm: Normal rate and regular rhythm.     Heart sounds: No murmur heard. No friction rub. No gallop.   Pulmonary:      Effort: Pulmonary effort is normal. No respiratory distress.     Breath sounds: Normal breath sounds. No wheezing, rhonchi or rales.  Chest:  Breasts:     Right: No axillary adenopathy or supraclavicular adenopathy.     Left: No axillary adenopathy or supraclavicular adenopathy.    Abdominal:     General: Bowel sounds are normal. There is no distension.     Palpations: Abdomen is soft. There is no mass.     Tenderness: There is no abdominal tenderness.  Musculoskeletal:        General: No swelling.     Right lower leg: No edema.     Left lower leg: No edema.  Lymphadenopathy:     Cervical: No cervical adenopathy.     Upper Body:     Right upper body: No supraclavicular or axillary adenopathy.     Left upper body: No supraclavicular or axillary adenopathy.     Lower Body: No right inguinal adenopathy. No left inguinal adenopathy.  Skin:    General: Skin is warm.     Coloration: Skin is not jaundiced.     Findings: No lesion or rash.  Neurological:     General: No focal deficit present.     Mental Status: She is alert and oriented to person, place, and time. Mental status is at baseline.     Cranial Nerves: Cranial nerves are intact.  Psychiatric:        Mood and Affect: Mood normal.        Behavior: Behavior normal.        Thought Content: Thought content normal.     LABS:   CBC Latest Ref Rng & Units 05/18/2020 10/09/2019 10/08/2019  WBC - 2.9 4.2 5.2  Hemoglobin 12.0 - 16.0 12.8 10.2(L) 10.0(L)  Hematocrit 36 - 46 40 30.8(L) 30.0(L)  Platelets 150 - 399 175 101(L) 84(L)   CMP Latest Ref Rng & Units 05/18/2020 10/09/2019 10/08/2019  Glucose 70 - 99 mg/dL - 102(H) 114(H)  BUN 4 - 21 21 33(H) 48(H)  Creatinine 0.5 - 1.1 1.0 1.36(H) 1.76(H)  Sodium 137 - 147 139 139 138  Potassium 3.4 - 5.3 4.0 4.6 4.7  Chloride 99 - 108 108 107 107  CO2 13 - 22 26(A) 27 22  Calcium 8.7 - 10.7 8.6(A) 8.4(L) 8.5(L)  Total Protein 6.5 - 8.1 g/dL - - -  Total Bilirubin 0.3 - 1.2 mg/dL  - - -  Alkaline Phos 25 - 125 59 - -  AST 13 - 35 22 - -  ALT 7 - 35 7 - -   Lab Results  Component Value Date   LDH 207 (H) 05/18/2020    STUDIES:  Her chest x-ray today showed no pulmonary nodules or other findings to signify late recurrence of her previous lymphoma.    ASSESSMENT & PLAN:  Assessment/Plan:  A 87 y.o. female with stage IIIAE extranodal marginal zone lymphoma, who approaches 7 years out from all of her previous therapy.  Based upon her physical exam, labs and chest x-ray today, the patient remains disease-free.  Unfortunately, her dementia is getting much worse.  Her granddaughter does not believe it is worth her coming to clinic any further to evaluate her lymphoma as her dementia is, by far, the most pressing health issue that will impact her morbidity/mortality.  I will discharge this patient from clinic.  However, the patient's family knows to contact us if an new hematologic or oncologic issues develop that require repeat clinical assessment.  The patient and her granddaughter understand all the plans discussed today and are in agreement with them.   Darrick Greenlaw Macarthur Critchley, MD

## 2020-05-31 ENCOUNTER — Encounter: Payer: Self-pay | Admitting: Oncology

## 2020-06-23 DIAGNOSIS — L819 Disorder of pigmentation, unspecified: Secondary | ICD-10-CM | POA: Diagnosis not present

## 2020-06-23 DIAGNOSIS — Z124 Encounter for screening for malignant neoplasm of cervix: Secondary | ICD-10-CM | POA: Diagnosis not present

## 2020-06-23 DIAGNOSIS — Z79899 Other long term (current) drug therapy: Secondary | ICD-10-CM | POA: Diagnosis not present

## 2020-06-23 DIAGNOSIS — M25562 Pain in left knee: Secondary | ICD-10-CM | POA: Diagnosis not present

## 2020-06-23 DIAGNOSIS — R319 Hematuria, unspecified: Secondary | ICD-10-CM | POA: Diagnosis not present

## 2020-06-23 DIAGNOSIS — Q6470 Unspecified congenital malformation of bladder and urethra: Secondary | ICD-10-CM | POA: Diagnosis not present

## 2020-07-05 DIAGNOSIS — L608 Other nail disorders: Secondary | ICD-10-CM | POA: Diagnosis not present

## 2020-07-19 DIAGNOSIS — N362 Urethral caruncle: Secondary | ICD-10-CM | POA: Diagnosis not present

## 2020-07-19 DIAGNOSIS — G309 Alzheimer's disease, unspecified: Secondary | ICD-10-CM | POA: Diagnosis not present

## 2020-07-19 DIAGNOSIS — M1712 Unilateral primary osteoarthritis, left knee: Secondary | ICD-10-CM | POA: Diagnosis not present

## 2020-07-19 DIAGNOSIS — I10 Essential (primary) hypertension: Secondary | ICD-10-CM | POA: Diagnosis not present

## 2020-07-27 DIAGNOSIS — N362 Urethral caruncle: Secondary | ICD-10-CM | POA: Diagnosis not present

## 2020-07-28 DIAGNOSIS — M1712 Unilateral primary osteoarthritis, left knee: Secondary | ICD-10-CM | POA: Diagnosis not present

## 2020-08-01 DIAGNOSIS — E538 Deficiency of other specified B group vitamins: Secondary | ICD-10-CM | POA: Diagnosis not present

## 2020-08-01 DIAGNOSIS — F0390 Unspecified dementia without behavioral disturbance: Secondary | ICD-10-CM | POA: Diagnosis not present

## 2020-08-21 DIAGNOSIS — I129 Hypertensive chronic kidney disease with stage 1 through stage 4 chronic kidney disease, or unspecified chronic kidney disease: Secondary | ICD-10-CM | POA: Diagnosis not present

## 2020-08-21 DIAGNOSIS — G301 Alzheimer's disease with late onset: Secondary | ICD-10-CM | POA: Diagnosis not present

## 2020-08-23 DIAGNOSIS — I499 Cardiac arrhythmia, unspecified: Secondary | ICD-10-CM | POA: Diagnosis not present

## 2020-08-23 DIAGNOSIS — R2689 Other abnormalities of gait and mobility: Secondary | ICD-10-CM | POA: Diagnosis not present

## 2020-08-23 DIAGNOSIS — G309 Alzheimer's disease, unspecified: Secondary | ICD-10-CM | POA: Diagnosis not present

## 2020-08-23 DIAGNOSIS — M533 Sacrococcygeal disorders, not elsewhere classified: Secondary | ICD-10-CM | POA: Diagnosis not present

## 2020-08-23 DIAGNOSIS — M25562 Pain in left knee: Secondary | ICD-10-CM | POA: Diagnosis not present

## 2020-08-23 DIAGNOSIS — Z043 Encounter for examination and observation following other accident: Secondary | ICD-10-CM | POA: Diagnosis not present

## 2020-08-23 DIAGNOSIS — F05 Delirium due to known physiological condition: Secondary | ICD-10-CM | POA: Diagnosis not present

## 2020-08-23 DIAGNOSIS — M1712 Unilateral primary osteoarthritis, left knee: Secondary | ICD-10-CM | POA: Diagnosis not present

## 2020-09-21 DIAGNOSIS — G301 Alzheimer's disease with late onset: Secondary | ICD-10-CM | POA: Diagnosis not present

## 2020-09-21 DIAGNOSIS — I129 Hypertensive chronic kidney disease with stage 1 through stage 4 chronic kidney disease, or unspecified chronic kidney disease: Secondary | ICD-10-CM | POA: Diagnosis not present

## 2020-09-29 DIAGNOSIS — Z0289 Encounter for other administrative examinations: Secondary | ICD-10-CM | POA: Diagnosis not present

## 2020-09-29 DIAGNOSIS — R21 Rash and other nonspecific skin eruption: Secondary | ICD-10-CM | POA: Diagnosis not present

## 2020-09-29 DIAGNOSIS — G309 Alzheimer's disease, unspecified: Secondary | ICD-10-CM | POA: Diagnosis not present

## 2020-10-19 DIAGNOSIS — N390 Urinary tract infection, site not specified: Secondary | ICD-10-CM | POA: Diagnosis not present

## 2020-10-21 DIAGNOSIS — G301 Alzheimer's disease with late onset: Secondary | ICD-10-CM | POA: Diagnosis not present

## 2020-10-21 DIAGNOSIS — I129 Hypertensive chronic kidney disease with stage 1 through stage 4 chronic kidney disease, or unspecified chronic kidney disease: Secondary | ICD-10-CM | POA: Diagnosis not present

## 2020-10-26 DIAGNOSIS — R1310 Dysphagia, unspecified: Secondary | ICD-10-CM | POA: Diagnosis not present

## 2020-10-26 DIAGNOSIS — F02C3 Dementia in other diseases classified elsewhere, severe, with mood disturbance: Secondary | ICD-10-CM | POA: Diagnosis not present

## 2020-10-26 DIAGNOSIS — I82220 Acute embolism and thrombosis of inferior vena cava: Secondary | ICD-10-CM | POA: Diagnosis not present

## 2020-10-26 DIAGNOSIS — R634 Abnormal weight loss: Secondary | ICD-10-CM | POA: Diagnosis not present

## 2020-10-26 DIAGNOSIS — M069 Rheumatoid arthritis, unspecified: Secondary | ICD-10-CM | POA: Diagnosis not present

## 2020-10-26 DIAGNOSIS — C8190 Hodgkin lymphoma, unspecified, unspecified site: Secondary | ICD-10-CM | POA: Diagnosis not present

## 2020-10-26 DIAGNOSIS — F02C4 Dementia in other diseases classified elsewhere, severe, with anxiety: Secondary | ICD-10-CM | POA: Diagnosis not present

## 2020-10-26 DIAGNOSIS — L89302 Pressure ulcer of unspecified buttock, stage 2: Secondary | ICD-10-CM | POA: Diagnosis not present

## 2020-10-26 DIAGNOSIS — I129 Hypertensive chronic kidney disease with stage 1 through stage 4 chronic kidney disease, or unspecified chronic kidney disease: Secondary | ICD-10-CM | POA: Diagnosis not present

## 2020-10-26 DIAGNOSIS — N1831 Chronic kidney disease, stage 3a: Secondary | ICD-10-CM | POA: Diagnosis not present

## 2020-10-26 DIAGNOSIS — G301 Alzheimer's disease with late onset: Secondary | ICD-10-CM | POA: Diagnosis not present

## 2020-10-26 DIAGNOSIS — M1990 Unspecified osteoarthritis, unspecified site: Secondary | ICD-10-CM | POA: Diagnosis not present

## 2020-10-26 DIAGNOSIS — Z87440 Personal history of urinary (tract) infections: Secondary | ICD-10-CM | POA: Diagnosis not present

## 2020-10-26 DIAGNOSIS — I82402 Acute embolism and thrombosis of unspecified deep veins of left lower extremity: Secondary | ICD-10-CM | POA: Diagnosis not present

## 2020-10-26 DIAGNOSIS — F02C2 Dementia in other diseases classified elsewhere, severe, with psychotic disturbance: Secondary | ICD-10-CM | POA: Diagnosis not present

## 2020-10-26 DIAGNOSIS — R630 Anorexia: Secondary | ICD-10-CM | POA: Diagnosis not present

## 2020-10-27 DIAGNOSIS — F02C3 Dementia in other diseases classified elsewhere, severe, with mood disturbance: Secondary | ICD-10-CM | POA: Diagnosis not present

## 2020-10-27 DIAGNOSIS — L89302 Pressure ulcer of unspecified buttock, stage 2: Secondary | ICD-10-CM | POA: Diagnosis not present

## 2020-10-27 DIAGNOSIS — G301 Alzheimer's disease with late onset: Secondary | ICD-10-CM | POA: Diagnosis not present

## 2020-10-27 DIAGNOSIS — R630 Anorexia: Secondary | ICD-10-CM | POA: Diagnosis not present

## 2020-10-27 DIAGNOSIS — F02C4 Dementia in other diseases classified elsewhere, severe, with anxiety: Secondary | ICD-10-CM | POA: Diagnosis not present

## 2020-10-27 DIAGNOSIS — F02C2 Dementia in other diseases classified elsewhere, severe, with psychotic disturbance: Secondary | ICD-10-CM | POA: Diagnosis not present

## 2020-10-28 DIAGNOSIS — L89302 Pressure ulcer of unspecified buttock, stage 2: Secondary | ICD-10-CM | POA: Diagnosis not present

## 2020-10-28 DIAGNOSIS — F02C2 Dementia in other diseases classified elsewhere, severe, with psychotic disturbance: Secondary | ICD-10-CM | POA: Diagnosis not present

## 2020-10-28 DIAGNOSIS — R630 Anorexia: Secondary | ICD-10-CM | POA: Diagnosis not present

## 2020-10-28 DIAGNOSIS — G301 Alzheimer's disease with late onset: Secondary | ICD-10-CM | POA: Diagnosis not present

## 2020-10-28 DIAGNOSIS — F02C4 Dementia in other diseases classified elsewhere, severe, with anxiety: Secondary | ICD-10-CM | POA: Diagnosis not present

## 2020-10-28 DIAGNOSIS — F02C3 Dementia in other diseases classified elsewhere, severe, with mood disturbance: Secondary | ICD-10-CM | POA: Diagnosis not present

## 2020-10-31 DIAGNOSIS — F02C4 Dementia in other diseases classified elsewhere, severe, with anxiety: Secondary | ICD-10-CM | POA: Diagnosis not present

## 2020-10-31 DIAGNOSIS — F02C2 Dementia in other diseases classified elsewhere, severe, with psychotic disturbance: Secondary | ICD-10-CM | POA: Diagnosis not present

## 2020-10-31 DIAGNOSIS — R630 Anorexia: Secondary | ICD-10-CM | POA: Diagnosis not present

## 2020-10-31 DIAGNOSIS — L89302 Pressure ulcer of unspecified buttock, stage 2: Secondary | ICD-10-CM | POA: Diagnosis not present

## 2020-10-31 DIAGNOSIS — F02C3 Dementia in other diseases classified elsewhere, severe, with mood disturbance: Secondary | ICD-10-CM | POA: Diagnosis not present

## 2020-10-31 DIAGNOSIS — G301 Alzheimer's disease with late onset: Secondary | ICD-10-CM | POA: Diagnosis not present

## 2020-11-02 DIAGNOSIS — L89302 Pressure ulcer of unspecified buttock, stage 2: Secondary | ICD-10-CM | POA: Diagnosis not present

## 2020-11-02 DIAGNOSIS — G301 Alzheimer's disease with late onset: Secondary | ICD-10-CM | POA: Diagnosis not present

## 2020-11-02 DIAGNOSIS — F02C2 Dementia in other diseases classified elsewhere, severe, with psychotic disturbance: Secondary | ICD-10-CM | POA: Diagnosis not present

## 2020-11-02 DIAGNOSIS — F02C4 Dementia in other diseases classified elsewhere, severe, with anxiety: Secondary | ICD-10-CM | POA: Diagnosis not present

## 2020-11-02 DIAGNOSIS — F02C3 Dementia in other diseases classified elsewhere, severe, with mood disturbance: Secondary | ICD-10-CM | POA: Diagnosis not present

## 2020-11-02 DIAGNOSIS — R630 Anorexia: Secondary | ICD-10-CM | POA: Diagnosis not present

## 2020-11-04 DIAGNOSIS — R630 Anorexia: Secondary | ICD-10-CM | POA: Diagnosis not present

## 2020-11-04 DIAGNOSIS — G301 Alzheimer's disease with late onset: Secondary | ICD-10-CM | POA: Diagnosis not present

## 2020-11-04 DIAGNOSIS — F02C2 Dementia in other diseases classified elsewhere, severe, with psychotic disturbance: Secondary | ICD-10-CM | POA: Diagnosis not present

## 2020-11-04 DIAGNOSIS — F02C3 Dementia in other diseases classified elsewhere, severe, with mood disturbance: Secondary | ICD-10-CM | POA: Diagnosis not present

## 2020-11-04 DIAGNOSIS — F02C4 Dementia in other diseases classified elsewhere, severe, with anxiety: Secondary | ICD-10-CM | POA: Diagnosis not present

## 2020-11-04 DIAGNOSIS — L89302 Pressure ulcer of unspecified buttock, stage 2: Secondary | ICD-10-CM | POA: Diagnosis not present

## 2020-11-07 DIAGNOSIS — R630 Anorexia: Secondary | ICD-10-CM | POA: Diagnosis not present

## 2020-11-07 DIAGNOSIS — F02C3 Dementia in other diseases classified elsewhere, severe, with mood disturbance: Secondary | ICD-10-CM | POA: Diagnosis not present

## 2020-11-07 DIAGNOSIS — F02C4 Dementia in other diseases classified elsewhere, severe, with anxiety: Secondary | ICD-10-CM | POA: Diagnosis not present

## 2020-11-07 DIAGNOSIS — G301 Alzheimer's disease with late onset: Secondary | ICD-10-CM | POA: Diagnosis not present

## 2020-11-07 DIAGNOSIS — F02C2 Dementia in other diseases classified elsewhere, severe, with psychotic disturbance: Secondary | ICD-10-CM | POA: Diagnosis not present

## 2020-11-07 DIAGNOSIS — L89302 Pressure ulcer of unspecified buttock, stage 2: Secondary | ICD-10-CM | POA: Diagnosis not present

## 2020-11-08 DIAGNOSIS — F02C3 Dementia in other diseases classified elsewhere, severe, with mood disturbance: Secondary | ICD-10-CM | POA: Diagnosis not present

## 2020-11-08 DIAGNOSIS — R630 Anorexia: Secondary | ICD-10-CM | POA: Diagnosis not present

## 2020-11-08 DIAGNOSIS — L89302 Pressure ulcer of unspecified buttock, stage 2: Secondary | ICD-10-CM | POA: Diagnosis not present

## 2020-11-08 DIAGNOSIS — G301 Alzheimer's disease with late onset: Secondary | ICD-10-CM | POA: Diagnosis not present

## 2020-11-08 DIAGNOSIS — F02C2 Dementia in other diseases classified elsewhere, severe, with psychotic disturbance: Secondary | ICD-10-CM | POA: Diagnosis not present

## 2020-11-08 DIAGNOSIS — F02C4 Dementia in other diseases classified elsewhere, severe, with anxiety: Secondary | ICD-10-CM | POA: Diagnosis not present

## 2020-11-09 DIAGNOSIS — L89302 Pressure ulcer of unspecified buttock, stage 2: Secondary | ICD-10-CM | POA: Diagnosis not present

## 2020-11-09 DIAGNOSIS — R630 Anorexia: Secondary | ICD-10-CM | POA: Diagnosis not present

## 2020-11-09 DIAGNOSIS — F02C4 Dementia in other diseases classified elsewhere, severe, with anxiety: Secondary | ICD-10-CM | POA: Diagnosis not present

## 2020-11-09 DIAGNOSIS — F02C2 Dementia in other diseases classified elsewhere, severe, with psychotic disturbance: Secondary | ICD-10-CM | POA: Diagnosis not present

## 2020-11-09 DIAGNOSIS — F02C3 Dementia in other diseases classified elsewhere, severe, with mood disturbance: Secondary | ICD-10-CM | POA: Diagnosis not present

## 2020-11-09 DIAGNOSIS — G301 Alzheimer's disease with late onset: Secondary | ICD-10-CM | POA: Diagnosis not present

## 2020-11-10 DIAGNOSIS — F02C2 Dementia in other diseases classified elsewhere, severe, with psychotic disturbance: Secondary | ICD-10-CM | POA: Diagnosis not present

## 2020-11-10 DIAGNOSIS — L89302 Pressure ulcer of unspecified buttock, stage 2: Secondary | ICD-10-CM | POA: Diagnosis not present

## 2020-11-10 DIAGNOSIS — F02C3 Dementia in other diseases classified elsewhere, severe, with mood disturbance: Secondary | ICD-10-CM | POA: Diagnosis not present

## 2020-11-10 DIAGNOSIS — F02C4 Dementia in other diseases classified elsewhere, severe, with anxiety: Secondary | ICD-10-CM | POA: Diagnosis not present

## 2020-11-10 DIAGNOSIS — R630 Anorexia: Secondary | ICD-10-CM | POA: Diagnosis not present

## 2020-11-10 DIAGNOSIS — G301 Alzheimer's disease with late onset: Secondary | ICD-10-CM | POA: Diagnosis not present

## 2020-11-11 DIAGNOSIS — L89302 Pressure ulcer of unspecified buttock, stage 2: Secondary | ICD-10-CM | POA: Diagnosis not present

## 2020-11-11 DIAGNOSIS — F02C4 Dementia in other diseases classified elsewhere, severe, with anxiety: Secondary | ICD-10-CM | POA: Diagnosis not present

## 2020-11-11 DIAGNOSIS — R630 Anorexia: Secondary | ICD-10-CM | POA: Diagnosis not present

## 2020-11-11 DIAGNOSIS — F02C3 Dementia in other diseases classified elsewhere, severe, with mood disturbance: Secondary | ICD-10-CM | POA: Diagnosis not present

## 2020-11-11 DIAGNOSIS — G301 Alzheimer's disease with late onset: Secondary | ICD-10-CM | POA: Diagnosis not present

## 2020-11-11 DIAGNOSIS — F02C2 Dementia in other diseases classified elsewhere, severe, with psychotic disturbance: Secondary | ICD-10-CM | POA: Diagnosis not present

## 2020-11-14 DIAGNOSIS — F02C3 Dementia in other diseases classified elsewhere, severe, with mood disturbance: Secondary | ICD-10-CM | POA: Diagnosis not present

## 2020-11-14 DIAGNOSIS — G301 Alzheimer's disease with late onset: Secondary | ICD-10-CM | POA: Diagnosis not present

## 2020-11-14 DIAGNOSIS — R630 Anorexia: Secondary | ICD-10-CM | POA: Diagnosis not present

## 2020-11-14 DIAGNOSIS — F02C2 Dementia in other diseases classified elsewhere, severe, with psychotic disturbance: Secondary | ICD-10-CM | POA: Diagnosis not present

## 2020-11-14 DIAGNOSIS — F02C4 Dementia in other diseases classified elsewhere, severe, with anxiety: Secondary | ICD-10-CM | POA: Diagnosis not present

## 2020-11-14 DIAGNOSIS — L89302 Pressure ulcer of unspecified buttock, stage 2: Secondary | ICD-10-CM | POA: Diagnosis not present

## 2020-11-16 DIAGNOSIS — G301 Alzheimer's disease with late onset: Secondary | ICD-10-CM | POA: Diagnosis not present

## 2020-11-16 DIAGNOSIS — R630 Anorexia: Secondary | ICD-10-CM | POA: Diagnosis not present

## 2020-11-16 DIAGNOSIS — F02C2 Dementia in other diseases classified elsewhere, severe, with psychotic disturbance: Secondary | ICD-10-CM | POA: Diagnosis not present

## 2020-11-16 DIAGNOSIS — F02C3 Dementia in other diseases classified elsewhere, severe, with mood disturbance: Secondary | ICD-10-CM | POA: Diagnosis not present

## 2020-11-16 DIAGNOSIS — L89302 Pressure ulcer of unspecified buttock, stage 2: Secondary | ICD-10-CM | POA: Diagnosis not present

## 2020-11-16 DIAGNOSIS — F02C4 Dementia in other diseases classified elsewhere, severe, with anxiety: Secondary | ICD-10-CM | POA: Diagnosis not present

## 2020-11-17 DIAGNOSIS — F02C4 Dementia in other diseases classified elsewhere, severe, with anxiety: Secondary | ICD-10-CM | POA: Diagnosis not present

## 2020-11-17 DIAGNOSIS — F02C3 Dementia in other diseases classified elsewhere, severe, with mood disturbance: Secondary | ICD-10-CM | POA: Diagnosis not present

## 2020-11-17 DIAGNOSIS — R630 Anorexia: Secondary | ICD-10-CM | POA: Diagnosis not present

## 2020-11-17 DIAGNOSIS — G301 Alzheimer's disease with late onset: Secondary | ICD-10-CM | POA: Diagnosis not present

## 2020-11-17 DIAGNOSIS — L89302 Pressure ulcer of unspecified buttock, stage 2: Secondary | ICD-10-CM | POA: Diagnosis not present

## 2020-11-17 DIAGNOSIS — F02C2 Dementia in other diseases classified elsewhere, severe, with psychotic disturbance: Secondary | ICD-10-CM | POA: Diagnosis not present

## 2020-11-18 DIAGNOSIS — G301 Alzheimer's disease with late onset: Secondary | ICD-10-CM | POA: Diagnosis not present

## 2020-11-18 DIAGNOSIS — F02C2 Dementia in other diseases classified elsewhere, severe, with psychotic disturbance: Secondary | ICD-10-CM | POA: Diagnosis not present

## 2020-11-18 DIAGNOSIS — L89302 Pressure ulcer of unspecified buttock, stage 2: Secondary | ICD-10-CM | POA: Diagnosis not present

## 2020-11-18 DIAGNOSIS — F02C3 Dementia in other diseases classified elsewhere, severe, with mood disturbance: Secondary | ICD-10-CM | POA: Diagnosis not present

## 2020-11-18 DIAGNOSIS — R630 Anorexia: Secondary | ICD-10-CM | POA: Diagnosis not present

## 2020-11-18 DIAGNOSIS — F02C4 Dementia in other diseases classified elsewhere, severe, with anxiety: Secondary | ICD-10-CM | POA: Diagnosis not present

## 2020-11-19 DIAGNOSIS — L89302 Pressure ulcer of unspecified buttock, stage 2: Secondary | ICD-10-CM | POA: Diagnosis not present

## 2020-11-19 DIAGNOSIS — F02C2 Dementia in other diseases classified elsewhere, severe, with psychotic disturbance: Secondary | ICD-10-CM | POA: Diagnosis not present

## 2020-11-19 DIAGNOSIS — F02C4 Dementia in other diseases classified elsewhere, severe, with anxiety: Secondary | ICD-10-CM | POA: Diagnosis not present

## 2020-11-19 DIAGNOSIS — R630 Anorexia: Secondary | ICD-10-CM | POA: Diagnosis not present

## 2020-11-19 DIAGNOSIS — F02C3 Dementia in other diseases classified elsewhere, severe, with mood disturbance: Secondary | ICD-10-CM | POA: Diagnosis not present

## 2020-11-19 DIAGNOSIS — G301 Alzheimer's disease with late onset: Secondary | ICD-10-CM | POA: Diagnosis not present

## 2020-11-20 DIAGNOSIS — G301 Alzheimer's disease with late onset: Secondary | ICD-10-CM | POA: Diagnosis not present

## 2020-11-20 DIAGNOSIS — L89302 Pressure ulcer of unspecified buttock, stage 2: Secondary | ICD-10-CM | POA: Diagnosis not present

## 2020-11-20 DIAGNOSIS — R630 Anorexia: Secondary | ICD-10-CM | POA: Diagnosis not present

## 2020-11-20 DIAGNOSIS — F02C4 Dementia in other diseases classified elsewhere, severe, with anxiety: Secondary | ICD-10-CM | POA: Diagnosis not present

## 2020-11-20 DIAGNOSIS — F02C2 Dementia in other diseases classified elsewhere, severe, with psychotic disturbance: Secondary | ICD-10-CM | POA: Diagnosis not present

## 2020-11-20 DIAGNOSIS — F02C3 Dementia in other diseases classified elsewhere, severe, with mood disturbance: Secondary | ICD-10-CM | POA: Diagnosis not present

## 2020-11-21 DIAGNOSIS — G301 Alzheimer's disease with late onset: Secondary | ICD-10-CM | POA: Diagnosis not present

## 2020-11-21 DIAGNOSIS — L89302 Pressure ulcer of unspecified buttock, stage 2: Secondary | ICD-10-CM | POA: Diagnosis not present

## 2020-11-21 DIAGNOSIS — F02C3 Dementia in other diseases classified elsewhere, severe, with mood disturbance: Secondary | ICD-10-CM | POA: Diagnosis not present

## 2020-11-21 DIAGNOSIS — F02C2 Dementia in other diseases classified elsewhere, severe, with psychotic disturbance: Secondary | ICD-10-CM | POA: Diagnosis not present

## 2020-11-21 DIAGNOSIS — R630 Anorexia: Secondary | ICD-10-CM | POA: Diagnosis not present

## 2020-11-21 DIAGNOSIS — F02C4 Dementia in other diseases classified elsewhere, severe, with anxiety: Secondary | ICD-10-CM | POA: Diagnosis not present

## 2020-11-22 DIAGNOSIS — F02C3 Dementia in other diseases classified elsewhere, severe, with mood disturbance: Secondary | ICD-10-CM | POA: Diagnosis not present

## 2020-11-22 DIAGNOSIS — I129 Hypertensive chronic kidney disease with stage 1 through stage 4 chronic kidney disease, or unspecified chronic kidney disease: Secondary | ICD-10-CM | POA: Diagnosis not present

## 2020-11-22 DIAGNOSIS — M1990 Unspecified osteoarthritis, unspecified site: Secondary | ICD-10-CM | POA: Diagnosis not present

## 2020-11-22 DIAGNOSIS — M069 Rheumatoid arthritis, unspecified: Secondary | ICD-10-CM | POA: Diagnosis not present

## 2020-11-22 DIAGNOSIS — G301 Alzheimer's disease with late onset: Secondary | ICD-10-CM | POA: Diagnosis not present

## 2020-11-22 DIAGNOSIS — F02C2 Dementia in other diseases classified elsewhere, severe, with psychotic disturbance: Secondary | ICD-10-CM | POA: Diagnosis not present

## 2020-11-22 DIAGNOSIS — R1310 Dysphagia, unspecified: Secondary | ICD-10-CM | POA: Diagnosis not present

## 2020-11-22 DIAGNOSIS — F02C4 Dementia in other diseases classified elsewhere, severe, with anxiety: Secondary | ICD-10-CM | POA: Diagnosis not present

## 2020-11-22 DIAGNOSIS — I82402 Acute embolism and thrombosis of unspecified deep veins of left lower extremity: Secondary | ICD-10-CM | POA: Diagnosis not present

## 2020-11-22 DIAGNOSIS — I82220 Acute embolism and thrombosis of inferior vena cava: Secondary | ICD-10-CM | POA: Diagnosis not present

## 2020-11-22 DIAGNOSIS — N1831 Chronic kidney disease, stage 3a: Secondary | ICD-10-CM | POA: Diagnosis not present

## 2020-11-22 DIAGNOSIS — Z87440 Personal history of urinary (tract) infections: Secondary | ICD-10-CM | POA: Diagnosis not present

## 2020-11-22 DIAGNOSIS — R630 Anorexia: Secondary | ICD-10-CM | POA: Diagnosis not present

## 2020-11-22 DIAGNOSIS — C8190 Hodgkin lymphoma, unspecified, unspecified site: Secondary | ICD-10-CM | POA: Diagnosis not present

## 2020-11-22 DIAGNOSIS — L89302 Pressure ulcer of unspecified buttock, stage 2: Secondary | ICD-10-CM | POA: Diagnosis not present

## 2020-11-22 DIAGNOSIS — R634 Abnormal weight loss: Secondary | ICD-10-CM | POA: Diagnosis not present

## 2020-11-23 DIAGNOSIS — F29 Unspecified psychosis not due to a substance or known physiological condition: Secondary | ICD-10-CM | POA: Diagnosis not present

## 2020-11-23 DIAGNOSIS — L89302 Pressure ulcer of unspecified buttock, stage 2: Secondary | ICD-10-CM | POA: Diagnosis not present

## 2020-11-23 DIAGNOSIS — F02C3 Dementia in other diseases classified elsewhere, severe, with mood disturbance: Secondary | ICD-10-CM | POA: Diagnosis not present

## 2020-11-23 DIAGNOSIS — F02C4 Dementia in other diseases classified elsewhere, severe, with anxiety: Secondary | ICD-10-CM | POA: Diagnosis not present

## 2020-11-23 DIAGNOSIS — F02C2 Dementia in other diseases classified elsewhere, severe, with psychotic disturbance: Secondary | ICD-10-CM | POA: Diagnosis not present

## 2020-11-23 DIAGNOSIS — Z7401 Bed confinement status: Secondary | ICD-10-CM | POA: Diagnosis not present

## 2020-11-23 DIAGNOSIS — G301 Alzheimer's disease with late onset: Secondary | ICD-10-CM | POA: Diagnosis not present

## 2020-11-23 DIAGNOSIS — R630 Anorexia: Secondary | ICD-10-CM | POA: Diagnosis not present

## 2020-11-24 DIAGNOSIS — G301 Alzheimer's disease with late onset: Secondary | ICD-10-CM | POA: Diagnosis not present

## 2020-11-24 DIAGNOSIS — F02C2 Dementia in other diseases classified elsewhere, severe, with psychotic disturbance: Secondary | ICD-10-CM | POA: Diagnosis not present

## 2020-11-24 DIAGNOSIS — F02C3 Dementia in other diseases classified elsewhere, severe, with mood disturbance: Secondary | ICD-10-CM | POA: Diagnosis not present

## 2020-11-24 DIAGNOSIS — L89302 Pressure ulcer of unspecified buttock, stage 2: Secondary | ICD-10-CM | POA: Diagnosis not present

## 2020-11-24 DIAGNOSIS — F02C4 Dementia in other diseases classified elsewhere, severe, with anxiety: Secondary | ICD-10-CM | POA: Diagnosis not present

## 2020-11-24 DIAGNOSIS — R630 Anorexia: Secondary | ICD-10-CM | POA: Diagnosis not present

## 2020-11-25 DIAGNOSIS — L89302 Pressure ulcer of unspecified buttock, stage 2: Secondary | ICD-10-CM | POA: Diagnosis not present

## 2020-11-25 DIAGNOSIS — R630 Anorexia: Secondary | ICD-10-CM | POA: Diagnosis not present

## 2020-11-25 DIAGNOSIS — G301 Alzheimer's disease with late onset: Secondary | ICD-10-CM | POA: Diagnosis not present

## 2020-11-25 DIAGNOSIS — F02C3 Dementia in other diseases classified elsewhere, severe, with mood disturbance: Secondary | ICD-10-CM | POA: Diagnosis not present

## 2020-11-25 DIAGNOSIS — F02C4 Dementia in other diseases classified elsewhere, severe, with anxiety: Secondary | ICD-10-CM | POA: Diagnosis not present

## 2020-11-25 DIAGNOSIS — F02C2 Dementia in other diseases classified elsewhere, severe, with psychotic disturbance: Secondary | ICD-10-CM | POA: Diagnosis not present

## 2020-11-28 DIAGNOSIS — L89302 Pressure ulcer of unspecified buttock, stage 2: Secondary | ICD-10-CM | POA: Diagnosis not present

## 2020-11-28 DIAGNOSIS — R630 Anorexia: Secondary | ICD-10-CM | POA: Diagnosis not present

## 2020-11-28 DIAGNOSIS — F02C2 Dementia in other diseases classified elsewhere, severe, with psychotic disturbance: Secondary | ICD-10-CM | POA: Diagnosis not present

## 2020-11-28 DIAGNOSIS — F02C3 Dementia in other diseases classified elsewhere, severe, with mood disturbance: Secondary | ICD-10-CM | POA: Diagnosis not present

## 2020-11-28 DIAGNOSIS — F02C4 Dementia in other diseases classified elsewhere, severe, with anxiety: Secondary | ICD-10-CM | POA: Diagnosis not present

## 2020-11-28 DIAGNOSIS — G301 Alzheimer's disease with late onset: Secondary | ICD-10-CM | POA: Diagnosis not present

## 2020-11-29 DIAGNOSIS — F02C2 Dementia in other diseases classified elsewhere, severe, with psychotic disturbance: Secondary | ICD-10-CM | POA: Diagnosis not present

## 2020-11-29 DIAGNOSIS — F02C4 Dementia in other diseases classified elsewhere, severe, with anxiety: Secondary | ICD-10-CM | POA: Diagnosis not present

## 2020-11-29 DIAGNOSIS — L89302 Pressure ulcer of unspecified buttock, stage 2: Secondary | ICD-10-CM | POA: Diagnosis not present

## 2020-11-29 DIAGNOSIS — F02C3 Dementia in other diseases classified elsewhere, severe, with mood disturbance: Secondary | ICD-10-CM | POA: Diagnosis not present

## 2020-11-29 DIAGNOSIS — G301 Alzheimer's disease with late onset: Secondary | ICD-10-CM | POA: Diagnosis not present

## 2020-11-29 DIAGNOSIS — R630 Anorexia: Secondary | ICD-10-CM | POA: Diagnosis not present

## 2020-11-30 DIAGNOSIS — F02C2 Dementia in other diseases classified elsewhere, severe, with psychotic disturbance: Secondary | ICD-10-CM | POA: Diagnosis not present

## 2020-11-30 DIAGNOSIS — F02C4 Dementia in other diseases classified elsewhere, severe, with anxiety: Secondary | ICD-10-CM | POA: Diagnosis not present

## 2020-11-30 DIAGNOSIS — F02C3 Dementia in other diseases classified elsewhere, severe, with mood disturbance: Secondary | ICD-10-CM | POA: Diagnosis not present

## 2020-11-30 DIAGNOSIS — L89302 Pressure ulcer of unspecified buttock, stage 2: Secondary | ICD-10-CM | POA: Diagnosis not present

## 2020-11-30 DIAGNOSIS — R630 Anorexia: Secondary | ICD-10-CM | POA: Diagnosis not present

## 2020-11-30 DIAGNOSIS — G301 Alzheimer's disease with late onset: Secondary | ICD-10-CM | POA: Diagnosis not present

## 2020-12-02 DIAGNOSIS — F02C3 Dementia in other diseases classified elsewhere, severe, with mood disturbance: Secondary | ICD-10-CM | POA: Diagnosis not present

## 2020-12-02 DIAGNOSIS — G301 Alzheimer's disease with late onset: Secondary | ICD-10-CM | POA: Diagnosis not present

## 2020-12-02 DIAGNOSIS — L89302 Pressure ulcer of unspecified buttock, stage 2: Secondary | ICD-10-CM | POA: Diagnosis not present

## 2020-12-02 DIAGNOSIS — F02C2 Dementia in other diseases classified elsewhere, severe, with psychotic disturbance: Secondary | ICD-10-CM | POA: Diagnosis not present

## 2020-12-02 DIAGNOSIS — F02C4 Dementia in other diseases classified elsewhere, severe, with anxiety: Secondary | ICD-10-CM | POA: Diagnosis not present

## 2020-12-02 DIAGNOSIS — R630 Anorexia: Secondary | ICD-10-CM | POA: Diagnosis not present

## 2020-12-05 DIAGNOSIS — L89302 Pressure ulcer of unspecified buttock, stage 2: Secondary | ICD-10-CM | POA: Diagnosis not present

## 2020-12-05 DIAGNOSIS — G301 Alzheimer's disease with late onset: Secondary | ICD-10-CM | POA: Diagnosis not present

## 2020-12-05 DIAGNOSIS — F02C2 Dementia in other diseases classified elsewhere, severe, with psychotic disturbance: Secondary | ICD-10-CM | POA: Diagnosis not present

## 2020-12-05 DIAGNOSIS — R630 Anorexia: Secondary | ICD-10-CM | POA: Diagnosis not present

## 2020-12-05 DIAGNOSIS — F02C3 Dementia in other diseases classified elsewhere, severe, with mood disturbance: Secondary | ICD-10-CM | POA: Diagnosis not present

## 2020-12-05 DIAGNOSIS — F02C4 Dementia in other diseases classified elsewhere, severe, with anxiety: Secondary | ICD-10-CM | POA: Diagnosis not present

## 2020-12-07 DIAGNOSIS — F02C4 Dementia in other diseases classified elsewhere, severe, with anxiety: Secondary | ICD-10-CM | POA: Diagnosis not present

## 2020-12-07 DIAGNOSIS — F02C3 Dementia in other diseases classified elsewhere, severe, with mood disturbance: Secondary | ICD-10-CM | POA: Diagnosis not present

## 2020-12-07 DIAGNOSIS — R630 Anorexia: Secondary | ICD-10-CM | POA: Diagnosis not present

## 2020-12-07 DIAGNOSIS — F02C2 Dementia in other diseases classified elsewhere, severe, with psychotic disturbance: Secondary | ICD-10-CM | POA: Diagnosis not present

## 2020-12-07 DIAGNOSIS — L89302 Pressure ulcer of unspecified buttock, stage 2: Secondary | ICD-10-CM | POA: Diagnosis not present

## 2020-12-07 DIAGNOSIS — G301 Alzheimer's disease with late onset: Secondary | ICD-10-CM | POA: Diagnosis not present

## 2020-12-09 DIAGNOSIS — F02C4 Dementia in other diseases classified elsewhere, severe, with anxiety: Secondary | ICD-10-CM | POA: Diagnosis not present

## 2020-12-09 DIAGNOSIS — F02C2 Dementia in other diseases classified elsewhere, severe, with psychotic disturbance: Secondary | ICD-10-CM | POA: Diagnosis not present

## 2020-12-09 DIAGNOSIS — L89302 Pressure ulcer of unspecified buttock, stage 2: Secondary | ICD-10-CM | POA: Diagnosis not present

## 2020-12-09 DIAGNOSIS — R630 Anorexia: Secondary | ICD-10-CM | POA: Diagnosis not present

## 2020-12-09 DIAGNOSIS — G301 Alzheimer's disease with late onset: Secondary | ICD-10-CM | POA: Diagnosis not present

## 2020-12-09 DIAGNOSIS — F02C3 Dementia in other diseases classified elsewhere, severe, with mood disturbance: Secondary | ICD-10-CM | POA: Diagnosis not present

## 2020-12-12 DIAGNOSIS — F02C3 Dementia in other diseases classified elsewhere, severe, with mood disturbance: Secondary | ICD-10-CM | POA: Diagnosis not present

## 2020-12-12 DIAGNOSIS — F02C4 Dementia in other diseases classified elsewhere, severe, with anxiety: Secondary | ICD-10-CM | POA: Diagnosis not present

## 2020-12-12 DIAGNOSIS — R630 Anorexia: Secondary | ICD-10-CM | POA: Diagnosis not present

## 2020-12-12 DIAGNOSIS — L89302 Pressure ulcer of unspecified buttock, stage 2: Secondary | ICD-10-CM | POA: Diagnosis not present

## 2020-12-12 DIAGNOSIS — G301 Alzheimer's disease with late onset: Secondary | ICD-10-CM | POA: Diagnosis not present

## 2020-12-12 DIAGNOSIS — F02C2 Dementia in other diseases classified elsewhere, severe, with psychotic disturbance: Secondary | ICD-10-CM | POA: Diagnosis not present

## 2020-12-13 DIAGNOSIS — L89302 Pressure ulcer of unspecified buttock, stage 2: Secondary | ICD-10-CM | POA: Diagnosis not present

## 2020-12-13 DIAGNOSIS — F02C2 Dementia in other diseases classified elsewhere, severe, with psychotic disturbance: Secondary | ICD-10-CM | POA: Diagnosis not present

## 2020-12-13 DIAGNOSIS — R630 Anorexia: Secondary | ICD-10-CM | POA: Diagnosis not present

## 2020-12-13 DIAGNOSIS — F02C4 Dementia in other diseases classified elsewhere, severe, with anxiety: Secondary | ICD-10-CM | POA: Diagnosis not present

## 2020-12-13 DIAGNOSIS — F02C3 Dementia in other diseases classified elsewhere, severe, with mood disturbance: Secondary | ICD-10-CM | POA: Diagnosis not present

## 2020-12-13 DIAGNOSIS — G301 Alzheimer's disease with late onset: Secondary | ICD-10-CM | POA: Diagnosis not present

## 2020-12-14 DIAGNOSIS — F02C2 Dementia in other diseases classified elsewhere, severe, with psychotic disturbance: Secondary | ICD-10-CM | POA: Diagnosis not present

## 2020-12-14 DIAGNOSIS — R630 Anorexia: Secondary | ICD-10-CM | POA: Diagnosis not present

## 2020-12-14 DIAGNOSIS — L89302 Pressure ulcer of unspecified buttock, stage 2: Secondary | ICD-10-CM | POA: Diagnosis not present

## 2020-12-14 DIAGNOSIS — F02C4 Dementia in other diseases classified elsewhere, severe, with anxiety: Secondary | ICD-10-CM | POA: Diagnosis not present

## 2020-12-14 DIAGNOSIS — G301 Alzheimer's disease with late onset: Secondary | ICD-10-CM | POA: Diagnosis not present

## 2020-12-14 DIAGNOSIS — F02C3 Dementia in other diseases classified elsewhere, severe, with mood disturbance: Secondary | ICD-10-CM | POA: Diagnosis not present

## 2020-12-16 DIAGNOSIS — R630 Anorexia: Secondary | ICD-10-CM | POA: Diagnosis not present

## 2020-12-16 DIAGNOSIS — F02C2 Dementia in other diseases classified elsewhere, severe, with psychotic disturbance: Secondary | ICD-10-CM | POA: Diagnosis not present

## 2020-12-16 DIAGNOSIS — F02C3 Dementia in other diseases classified elsewhere, severe, with mood disturbance: Secondary | ICD-10-CM | POA: Diagnosis not present

## 2020-12-16 DIAGNOSIS — L89302 Pressure ulcer of unspecified buttock, stage 2: Secondary | ICD-10-CM | POA: Diagnosis not present

## 2020-12-16 DIAGNOSIS — F02C4 Dementia in other diseases classified elsewhere, severe, with anxiety: Secondary | ICD-10-CM | POA: Diagnosis not present

## 2020-12-16 DIAGNOSIS — G301 Alzheimer's disease with late onset: Secondary | ICD-10-CM | POA: Diagnosis not present

## 2020-12-19 DIAGNOSIS — F02C2 Dementia in other diseases classified elsewhere, severe, with psychotic disturbance: Secondary | ICD-10-CM | POA: Diagnosis not present

## 2020-12-19 DIAGNOSIS — R630 Anorexia: Secondary | ICD-10-CM | POA: Diagnosis not present

## 2020-12-19 DIAGNOSIS — F02C3 Dementia in other diseases classified elsewhere, severe, with mood disturbance: Secondary | ICD-10-CM | POA: Diagnosis not present

## 2020-12-19 DIAGNOSIS — L89302 Pressure ulcer of unspecified buttock, stage 2: Secondary | ICD-10-CM | POA: Diagnosis not present

## 2020-12-19 DIAGNOSIS — F02C4 Dementia in other diseases classified elsewhere, severe, with anxiety: Secondary | ICD-10-CM | POA: Diagnosis not present

## 2020-12-19 DIAGNOSIS — G301 Alzheimer's disease with late onset: Secondary | ICD-10-CM | POA: Diagnosis not present

## 2020-12-20 DIAGNOSIS — R630 Anorexia: Secondary | ICD-10-CM | POA: Diagnosis not present

## 2020-12-20 DIAGNOSIS — F02C4 Dementia in other diseases classified elsewhere, severe, with anxiety: Secondary | ICD-10-CM | POA: Diagnosis not present

## 2020-12-20 DIAGNOSIS — G301 Alzheimer's disease with late onset: Secondary | ICD-10-CM | POA: Diagnosis not present

## 2020-12-20 DIAGNOSIS — F02C2 Dementia in other diseases classified elsewhere, severe, with psychotic disturbance: Secondary | ICD-10-CM | POA: Diagnosis not present

## 2020-12-20 DIAGNOSIS — F02C3 Dementia in other diseases classified elsewhere, severe, with mood disturbance: Secondary | ICD-10-CM | POA: Diagnosis not present

## 2020-12-20 DIAGNOSIS — L89302 Pressure ulcer of unspecified buttock, stage 2: Secondary | ICD-10-CM | POA: Diagnosis not present

## 2020-12-21 DIAGNOSIS — G301 Alzheimer's disease with late onset: Secondary | ICD-10-CM | POA: Diagnosis not present

## 2020-12-21 DIAGNOSIS — L89302 Pressure ulcer of unspecified buttock, stage 2: Secondary | ICD-10-CM | POA: Diagnosis not present

## 2020-12-21 DIAGNOSIS — R630 Anorexia: Secondary | ICD-10-CM | POA: Diagnosis not present

## 2020-12-21 DIAGNOSIS — F02C2 Dementia in other diseases classified elsewhere, severe, with psychotic disturbance: Secondary | ICD-10-CM | POA: Diagnosis not present

## 2020-12-21 DIAGNOSIS — F02C3 Dementia in other diseases classified elsewhere, severe, with mood disturbance: Secondary | ICD-10-CM | POA: Diagnosis not present

## 2020-12-21 DIAGNOSIS — F02C4 Dementia in other diseases classified elsewhere, severe, with anxiety: Secondary | ICD-10-CM | POA: Diagnosis not present

## 2020-12-22 DIAGNOSIS — M069 Rheumatoid arthritis, unspecified: Secondary | ICD-10-CM | POA: Diagnosis not present

## 2020-12-22 DIAGNOSIS — L89302 Pressure ulcer of unspecified buttock, stage 2: Secondary | ICD-10-CM | POA: Diagnosis not present

## 2020-12-22 DIAGNOSIS — R1310 Dysphagia, unspecified: Secondary | ICD-10-CM | POA: Diagnosis not present

## 2020-12-22 DIAGNOSIS — R634 Abnormal weight loss: Secondary | ICD-10-CM | POA: Diagnosis not present

## 2020-12-22 DIAGNOSIS — M1990 Unspecified osteoarthritis, unspecified site: Secondary | ICD-10-CM | POA: Diagnosis not present

## 2020-12-22 DIAGNOSIS — I82220 Acute embolism and thrombosis of inferior vena cava: Secondary | ICD-10-CM | POA: Diagnosis not present

## 2020-12-22 DIAGNOSIS — F02C3 Dementia in other diseases classified elsewhere, severe, with mood disturbance: Secondary | ICD-10-CM | POA: Diagnosis not present

## 2020-12-22 DIAGNOSIS — Z87440 Personal history of urinary (tract) infections: Secondary | ICD-10-CM | POA: Diagnosis not present

## 2020-12-22 DIAGNOSIS — I82402 Acute embolism and thrombosis of unspecified deep veins of left lower extremity: Secondary | ICD-10-CM | POA: Diagnosis not present

## 2020-12-22 DIAGNOSIS — C8190 Hodgkin lymphoma, unspecified, unspecified site: Secondary | ICD-10-CM | POA: Diagnosis not present

## 2020-12-22 DIAGNOSIS — R630 Anorexia: Secondary | ICD-10-CM | POA: Diagnosis not present

## 2020-12-22 DIAGNOSIS — F02C4 Dementia in other diseases classified elsewhere, severe, with anxiety: Secondary | ICD-10-CM | POA: Diagnosis not present

## 2020-12-22 DIAGNOSIS — F02C2 Dementia in other diseases classified elsewhere, severe, with psychotic disturbance: Secondary | ICD-10-CM | POA: Diagnosis not present

## 2020-12-22 DIAGNOSIS — I129 Hypertensive chronic kidney disease with stage 1 through stage 4 chronic kidney disease, or unspecified chronic kidney disease: Secondary | ICD-10-CM | POA: Diagnosis not present

## 2020-12-22 DIAGNOSIS — N1831 Chronic kidney disease, stage 3a: Secondary | ICD-10-CM | POA: Diagnosis not present

## 2020-12-22 DIAGNOSIS — G301 Alzheimer's disease with late onset: Secondary | ICD-10-CM | POA: Diagnosis not present

## 2020-12-23 DIAGNOSIS — L89302 Pressure ulcer of unspecified buttock, stage 2: Secondary | ICD-10-CM | POA: Diagnosis not present

## 2020-12-23 DIAGNOSIS — G301 Alzheimer's disease with late onset: Secondary | ICD-10-CM | POA: Diagnosis not present

## 2020-12-23 DIAGNOSIS — F02C2 Dementia in other diseases classified elsewhere, severe, with psychotic disturbance: Secondary | ICD-10-CM | POA: Diagnosis not present

## 2020-12-23 DIAGNOSIS — R630 Anorexia: Secondary | ICD-10-CM | POA: Diagnosis not present

## 2020-12-23 DIAGNOSIS — F02C3 Dementia in other diseases classified elsewhere, severe, with mood disturbance: Secondary | ICD-10-CM | POA: Diagnosis not present

## 2020-12-23 DIAGNOSIS — F02C4 Dementia in other diseases classified elsewhere, severe, with anxiety: Secondary | ICD-10-CM | POA: Diagnosis not present

## 2020-12-24 DIAGNOSIS — F02C2 Dementia in other diseases classified elsewhere, severe, with psychotic disturbance: Secondary | ICD-10-CM | POA: Diagnosis not present

## 2020-12-24 DIAGNOSIS — F02C3 Dementia in other diseases classified elsewhere, severe, with mood disturbance: Secondary | ICD-10-CM | POA: Diagnosis not present

## 2020-12-24 DIAGNOSIS — F02C4 Dementia in other diseases classified elsewhere, severe, with anxiety: Secondary | ICD-10-CM | POA: Diagnosis not present

## 2020-12-24 DIAGNOSIS — L89302 Pressure ulcer of unspecified buttock, stage 2: Secondary | ICD-10-CM | POA: Diagnosis not present

## 2020-12-24 DIAGNOSIS — R630 Anorexia: Secondary | ICD-10-CM | POA: Diagnosis not present

## 2020-12-24 DIAGNOSIS — G301 Alzheimer's disease with late onset: Secondary | ICD-10-CM | POA: Diagnosis not present

## 2021-01-22 DEATH — deceased
